# Patient Record
Sex: Male | Born: 1950 | Race: White | Hispanic: No | Marital: Single | Smoking: Former smoker
Health system: Southern US, Community
[De-identification: ages and names within clinical notes are randomized; demographics above are authoritative.]

## PROBLEM LIST (undated history)

## (undated) DIAGNOSIS — G4700 Insomnia, unspecified: Secondary | ICD-10-CM

## (undated) DIAGNOSIS — F419 Anxiety disorder, unspecified: Secondary | ICD-10-CM

## (undated) DIAGNOSIS — D329 Benign neoplasm of meninges, unspecified: Secondary | ICD-10-CM

## (undated) DIAGNOSIS — F329 Major depressive disorder, single episode, unspecified: Secondary | ICD-10-CM

## (undated) DIAGNOSIS — E785 Hyperlipidemia, unspecified: Secondary | ICD-10-CM

## (undated) DIAGNOSIS — K760 Fatty (change of) liver, not elsewhere classified: Secondary | ICD-10-CM

## (undated) DIAGNOSIS — F32A Depression, unspecified: Secondary | ICD-10-CM

## (undated) DIAGNOSIS — M5136 Other intervertebral disc degeneration, lumbar region: Secondary | ICD-10-CM

## (undated) DIAGNOSIS — R51 Headache: Secondary | ICD-10-CM

## (undated) DIAGNOSIS — M51369 Other intervertebral disc degeneration, lumbar region without mention of lumbar back pain or lower extremity pain: Secondary | ICD-10-CM

## (undated) DIAGNOSIS — G4730 Sleep apnea, unspecified: Secondary | ICD-10-CM

## (undated) DIAGNOSIS — K649 Unspecified hemorrhoids: Secondary | ICD-10-CM

## (undated) DIAGNOSIS — R519 Headache, unspecified: Secondary | ICD-10-CM

## (undated) DIAGNOSIS — K644 Residual hemorrhoidal skin tags: Secondary | ICD-10-CM

## (undated) DIAGNOSIS — Z973 Presence of spectacles and contact lenses: Secondary | ICD-10-CM

## (undated) DIAGNOSIS — M1990 Unspecified osteoarthritis, unspecified site: Secondary | ICD-10-CM

## (undated) HISTORY — DX: Other intervertebral disc degeneration, lumbar region: M51.36

## (undated) HISTORY — DX: Sleep apnea, unspecified: G47.30

## (undated) HISTORY — DX: Other intervertebral disc degeneration, lumbar region without mention of lumbar back pain or lower extremity pain: M51.369

## (undated) HISTORY — DX: Hyperlipidemia, unspecified: E78.5

## (undated) HISTORY — DX: Benign neoplasm of meninges, unspecified: D32.9

## (undated) HISTORY — PX: INGUINAL HERNIA REPAIR: SUR1180

---

## 2001-10-30 HISTORY — PX: KNEE ARTHROSCOPY: SUR90

## 2001-10-30 HISTORY — PX: CRANIOTOMY: SHX93

## 2004-08-04 ENCOUNTER — Ambulatory Visit (HOSPITAL_COMMUNITY): Admission: RE | Admit: 2004-08-04 | Discharge: 2004-08-04 | Payer: Self-pay | Admitting: General Surgery

## 2004-08-04 ENCOUNTER — Encounter (INDEPENDENT_AMBULATORY_CARE_PROVIDER_SITE_OTHER): Payer: Self-pay | Admitting: *Deleted

## 2004-08-04 ENCOUNTER — Ambulatory Visit (HOSPITAL_BASED_OUTPATIENT_CLINIC_OR_DEPARTMENT_OTHER): Admission: RE | Admit: 2004-08-04 | Discharge: 2004-08-04 | Payer: Self-pay | Admitting: General Surgery

## 2004-09-21 ENCOUNTER — Ambulatory Visit: Payer: Self-pay | Admitting: Internal Medicine

## 2004-11-21 ENCOUNTER — Ambulatory Visit: Payer: Self-pay | Admitting: Internal Medicine

## 2004-11-23 ENCOUNTER — Ambulatory Visit: Payer: Self-pay | Admitting: Internal Medicine

## 2005-06-22 ENCOUNTER — Ambulatory Visit: Payer: Self-pay | Admitting: Internal Medicine

## 2005-10-30 HISTORY — PX: COLONOSCOPY: SHX174

## 2005-12-06 ENCOUNTER — Ambulatory Visit: Payer: Self-pay | Admitting: Internal Medicine

## 2005-12-11 ENCOUNTER — Ambulatory Visit: Payer: Self-pay | Admitting: Internal Medicine

## 2005-12-19 ENCOUNTER — Ambulatory Visit: Payer: Self-pay | Admitting: Gastroenterology

## 2006-01-08 ENCOUNTER — Ambulatory Visit: Payer: Self-pay | Admitting: Gastroenterology

## 2006-05-10 ENCOUNTER — Ambulatory Visit: Payer: Self-pay | Admitting: Internal Medicine

## 2006-05-17 ENCOUNTER — Ambulatory Visit: Payer: Self-pay | Admitting: Internal Medicine

## 2006-06-12 ENCOUNTER — Ambulatory Visit: Payer: Self-pay | Admitting: Internal Medicine

## 2006-06-14 ENCOUNTER — Ambulatory Visit: Payer: Self-pay

## 2006-06-25 ENCOUNTER — Encounter: Admission: RE | Admit: 2006-06-25 | Discharge: 2006-06-25 | Payer: Self-pay | Admitting: Family Medicine

## 2006-06-29 ENCOUNTER — Encounter: Admission: RE | Admit: 2006-06-29 | Discharge: 2006-06-29 | Payer: Self-pay | Admitting: Family Medicine

## 2006-10-09 ENCOUNTER — Encounter: Admission: RE | Admit: 2006-10-09 | Discharge: 2006-10-09 | Payer: Self-pay | Admitting: Family Medicine

## 2006-10-09 DIAGNOSIS — K760 Fatty (change of) liver, not elsewhere classified: Secondary | ICD-10-CM

## 2006-10-09 HISTORY — DX: Fatty (change of) liver, not elsewhere classified: K76.0

## 2008-11-05 ENCOUNTER — Ambulatory Visit: Payer: Self-pay | Admitting: Sports Medicine

## 2008-11-05 DIAGNOSIS — M722 Plantar fascial fibromatosis: Secondary | ICD-10-CM | POA: Insufficient documentation

## 2010-11-20 ENCOUNTER — Encounter: Payer: Self-pay | Admitting: Family Medicine

## 2011-01-09 ENCOUNTER — Encounter: Payer: Self-pay | Admitting: *Deleted

## 2011-03-17 NOTE — Op Note (Signed)
NAME:  Ricardo Walker, Ricardo Walker NO.:  [id number]   MEDICAL RECORD NO.:  [id number]          PATIENT TYPE:  AMB   LOCATION:  DSC                          FACILITY:  [facility]   PHYSICIAN:  Adolph Pollack, M.D.DATE OF BIRTH:  04-11-1951   DATE OF PROCEDURE:  08/04/2004  DATE OF DISCHARGE:                                 OPERATIVE REPORT   PREOPERATIVE DIAGNOSIS:  Growing soft tissue mass, posterior neck.   POSTOPERATIVE DIAGNOSIS:  Growing soft tissue mass, posterior neck (2.5 cm).   OPERATION PERFORMED:  Excision of 2.5 cm soft tissue mass left posterior  neck.   SURGEON:  Adolph Pollack, M.D.   ANESTHESIA:  Local 1% lidocaine plus 0.5% plain Marcaine.   INDICATIONS FOR PROCEDURE:  This is a 60 year old male who has noticed a  growing soft tissue mass of the neck.  On examination, it measured  approximately 2 cm externally.  He now presents for excision of it.   DESCRIPTION OF PROCEDURE:  The patient was place prone on the minor  procedure table.  Some hair was clipped.  The area was sterilely prepped and  draped.  Local anesthetic was infiltrated superficially and then  circumferentially in the deep tissues around the area.  An elliptical  incision was made directly over the mass and then small skin flaps were  raised. Using sharp dissection, the mass was excised.  It measured 2.5 cm.  It was sent to pathology.  Bleeding was controlled with electrocautery.  Once hemostasis was adequate, the skin was closed with a running 3-0  Monocryl subcuticular stitch followed by Steri-Strips and sterile dressing.   The patient tolerated the procedure well without any apparent complications.  He will be sent home from the minor procedure room and he was in  satisfactory condition.  He was told to call for any signs of infection or  excessive bleeding, if not, see me back in 10 days.  He will be given some  Vicodin for pain.       TJR/MEDQ  D:  08/04/2004  T:  08/04/2004   Job:  [id number]   cc:   Valetta Mole. Swords, M.D. [facility]

## 2014-06-22 ENCOUNTER — Other Ambulatory Visit: Payer: Self-pay | Admitting: Family Medicine

## 2014-06-22 ENCOUNTER — Ambulatory Visit
Admission: RE | Admit: 2014-06-22 | Discharge: 2014-06-22 | Disposition: A | Discharge status: Home / Self Care | Payer: 59 | Source: Ambulatory Visit | Attending: Family Medicine | Admitting: Family Medicine

## 2014-06-22 DIAGNOSIS — M545 Low back pain: Secondary | ICD-10-CM

## 2014-07-15 ENCOUNTER — Other Ambulatory Visit: Payer: Self-pay | Admitting: Family Medicine

## 2014-07-15 DIAGNOSIS — R51 Headache: Secondary | ICD-10-CM

## 2014-07-15 DIAGNOSIS — D429 Neoplasm of uncertain behavior of meninges, unspecified: Secondary | ICD-10-CM

## 2014-07-22 ENCOUNTER — Ambulatory Visit
Admission: RE | Admit: 2014-07-22 | Discharge: 2014-07-22 | Disposition: A | Discharge status: Home / Self Care | Payer: 59 | Source: Ambulatory Visit | Attending: Family Medicine | Admitting: Family Medicine

## 2014-07-22 DIAGNOSIS — D429 Neoplasm of uncertain behavior of meninges, unspecified: Secondary | ICD-10-CM

## 2014-07-22 DIAGNOSIS — R51 Headache: Secondary | ICD-10-CM

## 2014-07-22 MED ORDER — GADOBENATE DIMEGLUMINE 529 MG/ML IV SOLN
16.0000 mL | Freq: Once | INTRAVENOUS | Status: AC | PRN
  Administered 2014-07-22: 16 mL via INTRAVENOUS

## 2014-09-14 ENCOUNTER — Ambulatory Visit (HOSPITAL_BASED_OUTPATIENT_CLINIC_OR_DEPARTMENT_OTHER): Payer: 59 | Attending: Family Medicine | Admitting: *Deleted

## 2014-09-14 VITALS — Ht 67.0 in

## 2014-09-14 DIAGNOSIS — G4700 Insomnia, unspecified: Secondary | ICD-10-CM | POA: Insufficient documentation

## 2014-09-14 DIAGNOSIS — G4730 Sleep apnea, unspecified: Secondary | ICD-10-CM | POA: Diagnosis present

## 2014-09-14 DIAGNOSIS — G4710 Hypersomnia, unspecified: Secondary | ICD-10-CM

## 2014-09-19 DIAGNOSIS — G4730 Sleep apnea, unspecified: Secondary | ICD-10-CM

## 2014-09-19 DIAGNOSIS — G4710 Hypersomnia, unspecified: Secondary | ICD-10-CM

## 2014-09-19 NOTE — Sleep Study (Signed)
    NAME: Ricardo Walker DATE OF BIRTH:  [birth year] MEDICAL RECORD NUMBER [id number]  LOCATION: [facility]  PHYSICIAN: YOUNG,CLINTON D  DATE OF STUDY: 09/14/2014  SLEEP STUDY TYPE: Out of Center Sleep Test- unattended                REFERRING PHYSICIAN: Marylene Land, MD  INDICATION FOR STUDY: Insomnia with sleep apnea  EPWORTH SLEEPINESS SCORE:   9/24 HEIGHT: 5\' 7"  (170.2 cm)  WEIGHT:   168 pounds  BMI 26.3 NECK SIZE:   in.  MEDICATIONS: Charted for review  IMPRESSION:  Moderate obstructive sleep apnea/hypopnea syndrome, AHI 20.9 per hour. 146 total events scored including 3 central apneas, 31 obstructive apneas, 4 mixed apneas, 108 hypopneas. Snoring with oxygen desaturation to a nadir of 85% and average oxygen saturation 93% on room air. Average heart rate 67.3 bpm    RECOMMENDATION:  Scores in this range are usually addressed first with CPAP. If appropriate, this patient can be scheduled for a dedicated CPAP titration study through the sleep disorder Center. Treatment for his insomnia complaint may help during adjustment to initial CPAP therapy if indicated.   Deneise Lever Diplomate, American Board of Sleep Medicine  ELECTRONICALLY SIGNED ON:  09/19/2014, 9:11 AM [place] PH: [phone]   FX: [phone] [place]

## 2014-10-06 ENCOUNTER — Ambulatory Visit (INDEPENDENT_AMBULATORY_CARE_PROVIDER_SITE_OTHER): Payer: 59 | Admitting: Pulmonary Disease

## 2014-10-06 ENCOUNTER — Encounter: Payer: Self-pay | Admitting: *Deleted

## 2014-10-06 ENCOUNTER — Encounter (INDEPENDENT_AMBULATORY_CARE_PROVIDER_SITE_OTHER): Payer: Self-pay

## 2014-10-06 VITALS — BP 116/60 | HR 73 | Temp 97.7°F | Ht 67.0 in | Wt 181.4 lb

## 2014-10-06 DIAGNOSIS — G4733 Obstructive sleep apnea (adult) (pediatric): Secondary | ICD-10-CM | POA: Insufficient documentation

## 2014-10-06 NOTE — Progress Notes (Signed)
Subjective:    Patient ID: Ricardo Walker, male    DOB: 1951/07/29, 63 y.o.   MRN: [id number]  HPI The patient is a 63 year old male who I've been asked to see for management of obstructive sleep apnea. He has had a recent home sleep test which showed an AHI of 21 events per hour, with transient oxygen desaturation as low as 85%. The patient has been noted to have snoring, but no one has ever commented on an abnormal breathing pattern during sleep. The patient has had long-standing insomnia, but over the last one year he has noticed worsening sleep and feeling exhausted during the day. He has frequent awakenings at night, and is not rested in the mornings even when he does get a good length of sleep. He has noticed significant sleep pressure with inactivity during the day, and this has affected his concentration and work efficiency. He falls asleep easily in the evenings while trying to watch television or read, but denies any sleepiness with driving. The patient states that his weight is up about 5-10 pounds over the last 2 years. His Epworth score today is abnormal at 10.   Sleep Questionnaire What time do you typically go to bed?( Between what hours) 11:30p-12:30A 11:30p-12:30A at 1519 on 10/06/14 by Inge Rise, CMA How long does it take you to fall asleep? 15-1 hr 15-1 hr at 1519 on 10/06/14 by Inge Rise, CMA How many times during the night do you wake up? 2 2 at 1519 on 10/06/14 by Inge Rise, CMA What time do you get out of bed to start your day? 0730 0730 at 1519 on 10/06/14 by Inge Rise, CMA Do you drive or operate heavy machinery in your occupation? No No at 1519 on 10/06/14 by Inge Rise, CMA How much has your weight changed (up or down) over the past two years? (In pounds) 10 lb (4.536 kg) 10 lb (4.536 kg) at 1519 on 10/06/14 by Inge Rise, CMA Have you ever had a sleep study before? Yes Yes at 1519 on 10/06/14 by [place] If yes, location of  study? home sleep test home sleep test at 1519 on 10/06/14 by Inge Rise, CMA If yes, date of study? 08/2014 08/2014 at 1519 on 10/06/14 by Inge Rise, CMA Do you currently use CPAP? No No at 1519 on 10/06/14 by Inge Rise, CMA Do you wear oxygen at any time? No    Review of Systems  Constitutional: Negative for fever and unexpected weight change.  HENT: Positive for congestion, sneezing and sore throat. Negative for dental problem, ear pain, nosebleeds, postnasal drip, rhinorrhea, sinus pressure and trouble swallowing.   Eyes: Negative for redness and itching.  Respiratory: Positive for cough. Negative for chest tightness, shortness of breath and wheezing.   Cardiovascular: Negative for palpitations and leg swelling.  Gastrointestinal: Negative for nausea and vomiting.  Genitourinary: Negative for dysuria.  Musculoskeletal: Negative for joint swelling.  Skin: Negative for rash.  Neurological: Positive for headaches.  Hematological: Does not bruise/bleed easily.  Psychiatric/Behavioral: Negative for dysphoric mood. The patient is nervous/anxious.        Objective:   Physical Exam Constitutional:  Well developed, no acute distress  HENT:  Nares patent without discharge  Oropharynx without exudate, palate and uvula are thick and elongated  Eyes:  Perrla, eomi, no scleral icterus  Neck:  No JVD, no TMG  Cardiovascular:  Normal rate, regular rhythm, no rubs or  gallops.  No murmurs        Intact distal pulses  Pulmonary :  Normal breath sounds, no stridor or respiratory distress   No rales, rhonchi, or wheezing  Abdominal:  Soft, nondistended, bowel sounds present.  No tenderness noted.   Musculoskeletal:  No lower extremity edema noted.  Lymph Nodes:  No cervical lymphadenopathy noted  Skin:  No cyanosis noted  Neurologic:  Alert, appropriate, moves all 4 extremities without obvious deficit.         Assessment & Plan:

## 2014-10-06 NOTE — Assessment & Plan Note (Signed)
The patient has at least moderate obstructive sleep apnea by his recent home sleep study, and has noticed worsening sleep and daytime sleepiness over the last one year. I have reviewed with him the pathophysiology of sleep disordered breathing, including its impact to his quality of life and cardiovascular health. I have outlined possible treatment options to include a trial of weight loss alone, dental appliance, and also C Pap. The patient does not feel that he has the energy or the discipline currently to be able to lose weight effectively. 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 He is most interested in getting this treated as quickly as possible so that he can feel better, and would like to start on C Pap as a trial. We can certainly arrange for a dental medicine consult as well, so he can look at other options. We'll discuss with him further at the next visit. The patient understands that his C Pap May short-term worsen his insomnia issue, although he may actually do much better. At some point, I think he would benefit greatly from cognitive behavioral therapy with a behavioral psychologist. I have asked him to discuss this further with his primary care physician in the future.

## 2014-10-06 NOTE — Patient Instructions (Signed)
Will arrange for cpap.  Please call if having tolerance issues. Work on weight reduction followup with me again in 8 weeks.

## 2014-10-26 ENCOUNTER — Institutional Professional Consult (permissible substitution): Payer: 59 | Admitting: Pulmonary Disease

## 2014-10-28 ENCOUNTER — Encounter (HOSPITAL_BASED_OUTPATIENT_CLINIC_OR_DEPARTMENT_OTHER): Payer: 59

## 2014-11-04 ENCOUNTER — Ambulatory Visit (HOSPITAL_BASED_OUTPATIENT_CLINIC_OR_DEPARTMENT_OTHER): Payer: 59

## 2014-12-03 ENCOUNTER — Ambulatory Visit (INDEPENDENT_AMBULATORY_CARE_PROVIDER_SITE_OTHER): Payer: 59 | Admitting: Pulmonary Disease

## 2014-12-03 ENCOUNTER — Encounter: Payer: Self-pay | Admitting: Pulmonary Disease

## 2014-12-03 VITALS — BP 132/78 | HR 85 | Temp 98.1°F | Ht 67.0 in | Wt 178.2 lb

## 2014-12-03 DIAGNOSIS — G4733 Obstructive sleep apnea (adult) (pediatric): Secondary | ICD-10-CM

## 2014-12-03 NOTE — Patient Instructions (Signed)
Try the dreamware nasal pillows to see if more comfortable Will refer to dentist for evaluation for possible oral appliance Will send a recommendation to your primary doctor to refer you to a psychologist for CBT to help with your insomnia followup with me in 52mos if doing well.

## 2014-12-03 NOTE — Progress Notes (Signed)
   Subjective:    Patient ID: Ricardo Walker, male    DOB: [birth year], 64 y.o.   MRN: [id number]  HPI The patient comes in today for follow-up of his obstructive sleep apnea. He was started on CPAP at the last visit, and has had variable response. When he sleeps through the night, he does extremely well. When he has issues with his insomnia, or problems with hose control, he sleeps poorly.  His download shows excellent control of his AHI, and no significant mask leak.   Review of Systems  Constitutional: Negative for fever and unexpected weight change.  HENT: Negative for congestion, dental problem, ear pain, nosebleeds, postnasal drip, rhinorrhea, sinus pressure, sneezing, sore throat and trouble swallowing.   Eyes: Negative for redness and itching.  Respiratory: Negative for cough, chest tightness, shortness of breath and wheezing.   Cardiovascular: Negative for palpitations and leg swelling.  Gastrointestinal: Negative for nausea and vomiting.  Genitourinary: Negative for dysuria.  Musculoskeletal: Negative for joint swelling.  Skin: Negative for rash.  Neurological: Negative for headaches.  Hematological: Does not bruise/bleed easily.  Psychiatric/Behavioral: Negative for dysphoric mood. The patient is not nervous/anxious.        Objective:   Physical Exam Well-developed male in no acute distress Nose without purulence or discharge noted Neck without lymphadenopathy or thyromegaly No skin breakdown or pressure necrosis from the C Pap mask Lower extremities without edema, no cyanosis Alert and oriented, moves all 4 extremities.       Assessment & Plan:

## 2014-12-03 NOTE — Assessment & Plan Note (Signed)
The patient has seen a definite improvement in his sleep with the C Pap, he continues to have some mask fit issues as well as problems with the hose. When he gets to sleep and sleeps through the night, he feels extremely rested, and wonderful during the day. His download shows excellent control of his AHI, and no significant mask leaks. I have shown him a new nasal mask that helps with hose control, and he is interested in trying this. He would also like to see a specialist for a dental appliance to see if this would work for him as well. Finally, he continues to have issues with his insomnia, and I will send a note to his primary physician to get him referred to a behavioral psychologist for cognitive behavioral therapy.

## 2014-12-11 ENCOUNTER — Telehealth: Payer: Self-pay | Admitting: Pulmonary Disease

## 2014-12-11 NOTE — Telephone Encounter (Signed)
Called [employer]-patient went by their office after his call to our office and received his mask. I left message stating we are aware he received his mask after the phone call today and if he has any other questions or concerns to please contact our office. Nothing more needed at this time.

## 2015-01-01 ENCOUNTER — Telehealth: Payer: Self-pay | Admitting: Pulmonary Disease

## 2015-01-01 NOTE — Telephone Encounter (Signed)
lmomtcb x1 for pt on named VM.  

## 2015-01-04 NOTE — Telephone Encounter (Signed)
Pt returned call - [phone]

## 2015-01-04 NOTE — Telephone Encounter (Signed)
Spoke with pt, states the last few weeks he has had difficulty exhaling because his pressure seems too high.  Also states the machine is very noise.  States his machine has a "pressure relief" button that is unavailable to him.  Wants to know what his option is with this.  Also states that he saw Dr. Ron Parker to explore a dental appliance option, but Dr. Ron Parker is not covered by his insurance.  Wants to know if there is another dentist [place] recommends.    Pt aware [place] is out until tomorrow. [place] please advise.  Thanks!

## 2015-01-06 NOTE — Telephone Encounter (Signed)
[place] please advise.  Thanks!

## 2015-01-06 NOTE — Telephone Encounter (Signed)
His machine is set on auto.  The machine will only give him what he needs.  If we decrease the pressure, it may not adequately control his sleep apnea.  The pressure relief button is only needed for pts who are on high pressure.  Can refer to dentist at [facility] or in [place].  Or he can get name from insurance who they do cover in [place]. Let me know and we can make the referral .

## 2015-01-07 NOTE — Telephone Encounter (Signed)
lmtcb x1 

## 2015-01-07 NOTE — Telephone Encounter (Signed)
Spoke with the pt and notified of recs per [facility]  He verbalized understanding  He states that he will call ins to check on Dentist here in town  In the meantime, he wants to know if trying a different mask would help  He currently uses a dream wear nasal mask, and when he turns over in bed it comes off slightly and makes loud noise  Please advise, thanks!

## 2015-01-07 NOTE — Telephone Encounter (Signed)
It may or may not help to change masks.  If he is not satisfied with current mask, can go to his home care company and have them work with him on a better fit.

## 2015-01-08 ENCOUNTER — Telehealth: Payer: Self-pay | Admitting: Pulmonary Disease

## 2015-01-08 NOTE — Telephone Encounter (Signed)
Virl Cagey, CMA at 01/08/2015 9:07 AM     Status: Signed       Expand All Collapse All   LMTCB x 1            Kathee Delton, MD at 01/07/2015 5:46 PM     Status: Signed       Expand All Collapse All   It may or may not help to change masks. If he is not satisfied with current mask, can go to his home care company and have them work with him on a better      --   Called spoke with pt. He reports his PCP told him that a home sleep study is not detailed as sleep study done in sleep center. He wants to know if he has a sleep study done over at sleep center, if this would show more information and if he would benefit from having this done. He is not sure if it is mask causing his problems. He reports this is disrupting his sleep and he has tried other masks. Pt is still looking into dental appliance. Please advise [place] thanks

## 2015-01-08 NOTE — Telephone Encounter (Signed)
Pt returning call.Ricardo Walker ° °

## 2015-01-08 NOTE — Telephone Encounter (Signed)
LMTCB x 1 

## 2015-01-13 ENCOUNTER — Other Ambulatory Visit: Payer: Self-pay | Admitting: Pulmonary Disease

## 2015-01-13 DIAGNOSIS — G4733 Obstructive sleep apnea (adult) (pediatric): Secondary | ICD-10-CM

## 2015-01-13 NOTE — Telephone Encounter (Signed)
Pt is aware of KC's recommendation. He would like to proceed with the titration study.

## 2015-01-13 NOTE — Telephone Encounter (Signed)
There is absolutely no benefit from having a regular sleep study at the sleep center.  He has sleep apnea, and the home study is very accurate for sleep apnea.   If he feels his device is not set correctly, we can send him to sleep center for a TITRATION study where his pressure is adjusted during the night while sleeping.  Let me know if he wants to do this.

## 2015-01-13 NOTE — Telephone Encounter (Signed)
lmtcb x1 

## 2015-01-13 NOTE — Telephone Encounter (Signed)
Pt returning call.Ricardo Walker ° °

## 2015-01-13 NOTE — Telephone Encounter (Signed)
Order put in computer.

## 2015-01-14 NOTE — Telephone Encounter (Signed)
Called and left detailed message on pt's personal voicemail stating the order has been placed and someone will be contacting him to get it scheduled and to call back if needed. Will sign off.

## 2015-02-03 ENCOUNTER — Ambulatory Visit (HOSPITAL_BASED_OUTPATIENT_CLINIC_OR_DEPARTMENT_OTHER): Payer: 59 | Attending: Pulmonary Disease

## 2015-02-03 VITALS — Ht 68.0 in | Wt 170.0 lb

## 2015-02-03 DIAGNOSIS — G4733 Obstructive sleep apnea (adult) (pediatric): Secondary | ICD-10-CM | POA: Insufficient documentation

## 2015-02-03 DIAGNOSIS — G4710 Hypersomnia, unspecified: Secondary | ICD-10-CM | POA: Diagnosis present

## 2015-02-05 ENCOUNTER — Telehealth: Payer: Self-pay | Admitting: Pulmonary Disease

## 2015-02-05 NOTE — Telephone Encounter (Signed)
lmomtcb x1 for pt I have not received anything

## 2015-02-08 NOTE — Telephone Encounter (Signed)
lmtcb x2 

## 2015-02-09 NOTE — Telephone Encounter (Signed)
lmtcb

## 2015-02-10 NOTE — Telephone Encounter (Signed)
LMTCB and will close per triage protocol 

## 2015-02-12 ENCOUNTER — Telehealth: Payer: Self-pay | Admitting: Pulmonary Disease

## 2015-02-12 DIAGNOSIS — G4733 Obstructive sleep apnea (adult) (pediatric): Secondary | ICD-10-CM

## 2015-02-12 NOTE — Telephone Encounter (Signed)
lmomtcb x1 

## 2015-02-12 NOTE — Telephone Encounter (Signed)
Let pt know that his titration study shows good control of his sleep apnea with 6cm of pressure.  We can have his machine put on that level and see how he does.  Ok to send order if he is ok with that.

## 2015-02-12 NOTE — Sleep Study (Signed)
   NAME: Ricardo Walker DATE OF BIRTH:  03-Jul-1951 MEDICAL RECORD NUMBER [id number]  LOCATION: [facility]  PHYSICIAN: Kathee Delton  DATE OF STUDY: 02/03/2015  SLEEP STUDY TYPE: Nocturnal Polysomnogram               REFERRING PHYSICIAN: Burrell Hodapp, Armando Reichert, MD  INDICATION FOR STUDY: Hypersomnia with sleep apnea  EPWORTH SLEEPINESS SCORE:  7 HEIGHT: 5\' 8"  (172.7 cm)  WEIGHT: 170 lb (77.111 kg)    Body mass index is 25.85 kg/(m^2).  NECK SIZE: 16 in.  MEDICATIONS: Reviewed in the sleep record  SLEEP ARCHITECTURE: The patient had a total sleep time of 257 minutes with no slow-wave sleep but adequate quantity of REM. Sleep onset latency was prolonged at 68 minutes, and REM onset was normal. Sleep efficiency was moderately reduced at 71%.  RESPIRATORY DATA: The patient underwent a CPAP titration study with a [employer]ics dreamware nasal pillows system.  His C pressure was increased in order to treat both obstructive events and snoring, and he was found to have an optimal CPAP pressure of 6 cm.  He had good control of his events even through supine REM.  OXYGEN DATA: There was transient oxygen desaturation as low as 89% with the patient's obstructive events  CARDIAC DATA: No clinically significant arrhythmias were noted  MOVEMENT/PARASOMNIA: No significant periodic limb movements or other abnormal behaviors were seen.  IMPRESSION/ RECOMMENDATION:    1) good control of previously documented obstructive sleep apnea with a sleep apnea or of 6 cm, along with a Respironics dreamwear nasal pillows system.    [place], American Board of Sleep Medicine  ELECTRONICALLY SIGNED ON:  02/12/2015, 4:37 PM [place] PH: [phone]   FX: [phone] [place]

## 2015-02-15 NOTE — Telephone Encounter (Signed)
Pt wanting to wait to see if he qualifies for the dental device before cancelling the cpap.  Pt will keep [place] updated with this so we know when do d/c cpap.   Order placed for oral appliance.  Nothing further needed.

## 2015-02-15 NOTE — Telephone Encounter (Signed)
Called spoke with pt. He is wanting the oral appliance and not use the CPAP. He is wanting referral sent to [facility] Dr. Merla Riches school of dentistry. Please advise [place] thanks

## 2015-02-15 NOTE — Telephone Encounter (Signed)
[place] for dental medicine referral to Dr. Merla Riches at [facility] for OSA If pt is ok with this, would also send order to d/c his cpap to his DME

## 2015-03-05 ENCOUNTER — Ambulatory Visit
Admission: RE | Admit: 2015-03-05 | Discharge: 2015-03-05 | Disposition: A | Discharge status: Home / Self Care | Payer: 59 | Source: Ambulatory Visit | Attending: Family Medicine | Admitting: Family Medicine

## 2015-03-05 ENCOUNTER — Other Ambulatory Visit: Payer: Self-pay | Admitting: Family Medicine

## 2015-03-05 DIAGNOSIS — R05 Cough: Secondary | ICD-10-CM

## 2015-03-05 DIAGNOSIS — R059 Cough, unspecified: Secondary | ICD-10-CM

## 2015-04-23 ENCOUNTER — Telehealth: Payer: Self-pay | Admitting: Pulmonary Disease

## 2015-04-23 NOTE — Telephone Encounter (Signed)
Spoke with pt, states that his insurance is not going to be covering his dental appliance for sleep apnea d/t "not being medically necessary".  I advised that his necessary records were sent on 4/21 to Dr. Lupita Shutter office according to our referral notes.  I advised pt that Dr. Lupita Shutter office would be his best contact as to why this is not being covered.  Pt understands.  Nothing further needed at this time .

## 2015-06-03 ENCOUNTER — Ambulatory Visit: Payer: 59 | Admitting: Pulmonary Disease

## 2015-07-23 ENCOUNTER — Other Ambulatory Visit: Payer: Self-pay | Admitting: Family Medicine

## 2015-07-23 ENCOUNTER — Ambulatory Visit
Admission: RE | Admit: 2015-07-23 | Discharge: 2015-07-23 | Disposition: A | Discharge status: Home / Self Care | Payer: 59 | Source: Ambulatory Visit | Attending: Family Medicine | Admitting: Family Medicine

## 2015-07-23 DIAGNOSIS — M79672 Pain in left foot: Secondary | ICD-10-CM

## 2015-09-30 HISTORY — PX: HAMMER TOE SURGERY: SHX385

## 2016-01-26 ENCOUNTER — Other Ambulatory Visit: Payer: Self-pay | Admitting: Family Medicine

## 2016-01-26 DIAGNOSIS — R519 Headache, unspecified: Secondary | ICD-10-CM

## 2016-01-26 DIAGNOSIS — R51 Headache: Principal | ICD-10-CM

## 2016-01-27 ENCOUNTER — Encounter: Payer: Self-pay | Admitting: Internal Medicine

## 2016-02-07 ENCOUNTER — Inpatient Hospital Stay: Admission: RE | Admit: 2016-02-07 | Payer: Self-pay | Source: Ambulatory Visit

## 2016-03-16 ENCOUNTER — Encounter: Payer: Self-pay | Admitting: Internal Medicine

## 2016-03-16 ENCOUNTER — Ambulatory Visit (AMBULATORY_SURGERY_CENTER): Payer: Self-pay | Admitting: *Deleted

## 2016-03-16 VITALS — Ht 67.0 in | Wt 165.0 lb

## 2016-03-16 DIAGNOSIS — Z1211 Encounter for screening for malignant neoplasm of colon: Secondary | ICD-10-CM

## 2016-03-16 NOTE — Progress Notes (Signed)
No egg or soy allergy known to patient  No issues with past sedation with any surgeries  or procedures, no intubation problems  No diet pills per patient No home 02 use per patient  No blood thinners per patient  Pt denies issues with constipation   

## 2016-03-30 ENCOUNTER — Encounter: Payer: BLUE CROSS/BLUE SHIELD | Admitting: Internal Medicine

## 2016-03-30 ENCOUNTER — Encounter: Payer: Self-pay | Admitting: Internal Medicine

## 2016-05-12 ENCOUNTER — Encounter: Payer: Self-pay | Admitting: Internal Medicine

## 2016-07-11 ENCOUNTER — Inpatient Hospital Stay: Admission: RE | Admit: 2016-07-11 | Payer: Self-pay | Source: Ambulatory Visit

## 2016-07-18 ENCOUNTER — Inpatient Hospital Stay
Admission: RE | Admit: 2016-07-18 | Discharge: 2016-07-18 | Disposition: A | Discharge status: Home / Self Care | Payer: BLUE CROSS/BLUE SHIELD | Source: Ambulatory Visit | Attending: Family Medicine | Admitting: Family Medicine

## 2016-07-24 ENCOUNTER — Ambulatory Visit
Admission: RE | Admit: 2016-07-24 | Discharge: 2016-07-24 | Disposition: A | Discharge status: Home / Self Care | Payer: BLUE CROSS/BLUE SHIELD | Source: Ambulatory Visit | Attending: Family Medicine | Admitting: Family Medicine

## 2016-07-24 DIAGNOSIS — R519 Headache, unspecified: Secondary | ICD-10-CM

## 2016-07-24 DIAGNOSIS — R51 Headache: Principal | ICD-10-CM

## 2016-07-24 MED ORDER — GADOBENATE DIMEGLUMINE 529 MG/ML IV SOLN
14.0000 mL | Freq: Once | INTRAVENOUS | Status: AC | PRN
  Administered 2016-07-24: 14 mL via INTRAVENOUS

## 2016-08-08 ENCOUNTER — Encounter: Payer: Self-pay | Admitting: Pulmonary Disease

## 2016-08-09 ENCOUNTER — Encounter: Payer: Self-pay | Admitting: Pulmonary Disease

## 2016-08-09 ENCOUNTER — Ambulatory Visit (INDEPENDENT_AMBULATORY_CARE_PROVIDER_SITE_OTHER): Payer: BLUE CROSS/BLUE SHIELD | Admitting: Pulmonary Disease

## 2016-08-09 DIAGNOSIS — G4733 Obstructive sleep apnea (adult) (pediatric): Secondary | ICD-10-CM | POA: Diagnosis not present

## 2016-08-09 NOTE — Progress Notes (Signed)
Subjective:    Patient ID: Ricardo Walker, male    DOB: [birth year], 65 y.o.   MRN: [id number]  HPI   Patient is in the office as follow-up on his sleep apnea. He was last seen by Dr. Gwenette Greet on 11/2014 for OSA. He had a HST in 08/2014 which showed AHI of 21. He has an autocpap.  He has had mask issues. He has tried several masks and the only one working finally was Lawyer. The last couple months however, team where it doesn't seem to be working as well. He does not remember trying a full face mask. Patient also has issues with his CPAP per se. He is very uncomfortable using it. Gets claustrophobic. He is not sure if the pressures too much. Because of his issues with his CPAP and the mass, he gets poor quality sleep. He has frequent awakenings, has hypersomnia, unrefreshed sleep. He gets very moody and irritable June the daytime because of this.  He goes to bed anywhere  from 11pm -1 am. Gets up at 7:30am. Sleeps 6-7 hrs/night. Very exhausted.  He denies being depressed. Denies anxiety.    He tried getting an oral device. He had gone to Dr. Merla Riches at [facility] as well as Dr. Ron Parker in [place]. Both times, his insurance denied his oral device. He currently has a Education officer, museum.   Review of Systems  Constitutional: Negative.   HENT: Negative.   Eyes: Negative.   Respiratory: Negative.   Cardiovascular: Negative.   Endocrine: Negative.   Genitourinary: Negative.   Musculoskeletal: Negative.   Skin: Negative.   Allergic/Immunologic: Negative.   Neurological: Negative.   Hematological: Negative.   Psychiatric/Behavioral: Negative.   All other systems reviewed and are negative.  Past Medical History:  Diagnosis Date  . DDD (degenerative disc disease), lumbar    toe  . Hyperlipidemia   . Meningioma ([place])   . Sleep apnea    cpap   (-) CA, DVT  Family History  Problem Relation Age of Onset  . Pancreatic cancer      grandparent  . Heart disease    . Pancreatic  cancer Paternal Grandfather   . Colon cancer Neg Hx   . Colon polyps Neg Hx   . Rectal cancer Neg Hx   . Stomach cancer Neg Hx      Past Surgical History:  Procedure Laterality Date  . COLONOSCOPY    . CRANIOTOMY  2003  . HAMMER TOE SURGERY  09/2015  . INGUINAL HERNIA REPAIR  [id number]   l,r  . KNEE ARTHROSCOPY  2003   right    Social History   Social History  . Marital status: Single    Spouse name: N/A  . Number of children: N/A  . Years of education: N/A   Occupational History  . realtor    Social History Main Topics  . Smoking status: Former Smoker    Packs/day: 0.25    Years: 10.00    Types: Cigarettes    Quit date: 10/30/2010  . Smokeless tobacco: Never Used     Comment: social smoker  . Alcohol use 0.0 oz/week     Comment: 2-7 per week  . Drug use: No  . Sexual activity: Not on file   Other Topics Concern  . Not on file   Social History Narrative  . No narrative on file     Single, lives by himeslf. Is a realtor. Drinks few drinks nightly.  No Known Allergies  Outpatient Medications Prior to Visit  Medication Sig Dispense Refill  . ALPRAZolam (XANAX) 0.5 MG tablet Take 0.25-0.5 mg by mouth at bedtime as needed for sleep.    Marland Kitchen glucosamine-chondroitin 500-400 MG tablet Take 1 tablet by mouth 2 (two) times daily before a meal.    . Multiple Vitamin (MULTIVITAMIN) tablet Take 1 tablet by mouth daily.     No facility-administered medications prior to visit.    Meds ordered this encounter  Medications  . amitriptyline (ELAVIL) 10 MG tablet    Sig: as directed.    Refill:  4        Objective:   Physical Exam   Vitals:  Vitals:   08/09/16 1556  BP: 120/68  Pulse: (!) 57  SpO2: 97%  Weight: 161 lb 6.4 oz (73.2 kg)  Height: 5\' 7"  (1.702 m)    Constitutional/General:  Pleasant, well-nourished, well-developed, not in any distress,  Comfortably seating.  Well kempt  Body mass index is 25.28 kg/m. Wt Readings from Last 3 Encounters:    08/09/16 161 lb 6.4 oz (73.2 kg)  03/16/16 165 lb (74.8 kg)  02/03/15 170 lb (77.1 kg)     HEENT: Pupils equal and reactive to light and accommodation. Anicteric sclerae. Normal nasal mucosa.   No oral  lesions,  mouth clear,  oropharynx clear, no postnasal drip. (-) Oral thrush. No dental caries. Mild retrognathia.  Airway - Mallampati class II  Neck: No masses. Midline trachea. No JVD, (-) LAD. (-) bruits appreciated.  Respiratory/Chest: Grossly normal chest. (-) deformity. (-) Accessory muscle use.  Symmetric expansion. (-) Tenderness on palpation.  Resonant on percussion.  Diminished BS on both lower lung zones. (-) wheezing, crackles, rhonchi (-) egophony  Cardiovascular: Regular rate and  rhythm, heart sounds normal, no murmur or gallops, no peripheral edema  Gastrointestinal:  Normal bowel sounds. Soft, non-tender. No hepatosplenomegaly.  (-) masses.   Musculoskeletal:  Normal muscle tone. Normal gait.   Extremities: Grossly normal. (-) clubbing, cyanosis.  (-) edema  Skin: (-) rash,lesions seen.   Neurological/Psychiatric : alert, oriented to time, place, person. Normal mood and affect         Assessment & Plan:  OSA (obstructive sleep apnea) Patient had home sleep test in November for 2015 which showed AHI of 21. He has an auto CPAP, 5-15 cm water.  Patient has had issues tolerating his CPAP. He was last seen by Dr. Gwenette Greet in 11/2014. Since that time, he went to Dr. Merla Riches at [facility] for oral device. His oral device was not approved by his insurance. He also went to Dr. Ron Parker in [place]. Oral device was not approved.  The sense I am  getting from this patient is he is miserable with CPAP use since day 1.  He has a lot of issues with CPAP. He is uncomfortable with it. Has claustrophobia. He has tried several masks. The last mask which worked was Lawyer but lately it is not working as well. I believe he has not tried fullface mask. He thinks the pressure is too  much as well. Because of his issues, he does not get good sleep, has frequent awakenings, wakes up unrefreshed. Has hypersomnia in the morning which affects his functionality. He gets moody and irritable in the morning when he is not able to get good sleep.  Download the last month: 87%, AHI 0.3. (oct 2017)  Plan :  We extensively discussed the importance of treating OSA and the need to use PAP therapy. I told  him, at the end of the day, I don't want him miserable when he uses a CPAP. I discussed with him I will try to help him use his CPAP but if he's been miserable since day 1 of using it, sometimes it's hard to change that perception.  Continue with autocpap > we will decrease the pressure to 5-10 centimeters water and will need a download on that. Potentially, we can decrease it further to 5-8 centimeters water.  We will schedule the patient to have a mask fitting session with one of our sleep techs here in the lab. Hopefully, dreamwear will work again. He can also try a full face mask. Cloth mask is also another consideration.   Patient was instructed to have mask, tubings, filter, reservoir cleaned at least once a week with soapy water.  Patient was instructed to call the office if he/she is having issues with the PAP device.   I advised patient to obtain sufficient amount of sleep --  7 to 8 hours at least in a 24 hr period.  Patient was advised to follow good sleep hygiene.  Patient was advised NOT to engage in activities requiring concentration and/or vigilance if he/she is and  sleepy.  Patient is NOT to drive if he/she is sleepy.   I told the patient to go ahead and follow up with Dr. Ron Parker to see whether he can have an oral device approved with his new insurance.     Return to clinic in 6 weeks.  I spent at least 25 minutes with the patient today and more than 50% was spent counseling the patient regarding disease and management and facilitating labs and medications.        Monica Becton, MD 08/10/2016, 10:07 AM [place] [facility] Pager [phone] After 3 pm or if no answer, call [phone]

## 2016-08-09 NOTE — Assessment & Plan Note (Addendum)
Patient had home sleep test in November for 2015 which showed AHI of 21. He has an auto CPAP, 5-15 cm water.  Patient has had issues tolerating his CPAP. He was last seen by Dr. Gwenette Greet in 11/2014. Since that time, he went to Dr. Merla Riches at [facility] for oral device. His oral device was not approved by his insurance. He also went to Dr. Ron Parker in [place]. Oral device was not approved.  The sense I am  getting from this patient is he is miserable with CPAP use since day 1.  He has a lot of issues with CPAP. He is uncomfortable with it. Has claustrophobia. He has tried several masks. The last mask which worked was Lawyer but lately it is not working as well. I believe he has not tried fullface mask. He thinks the pressure is too much as well. Because of his issues, he does not get good sleep, has frequent awakenings, wakes up unrefreshed. Has hypersomnia in the morning which affects his functionality. He gets moody and irritable in the morning when he is not able to get good sleep.  Download the last month: 87%, AHI 0.3. (oct 2017)  Plan :  We extensively discussed the importance of treating OSA and the need to use PAP therapy. I told him, at the end of the day, I don't want him miserable when he uses a CPAP. I discussed with him I will try to help him use his CPAP but if he's been miserable since day 1 of using it, sometimes it's hard to change that perception.  Continue with autocpap > we will decrease the pressure to 5-10 centimeters water and will need a download on that. Potentially, we can decrease it further to 5-8 centimeters water.  We will schedule the patient to have a mask fitting session with one of our sleep techs here in the lab. Hopefully, dreamwear will work again. He can also try a full face mask. Cloth mask is also another consideration.   Patient was instructed to have mask, tubings, filter, reservoir cleaned at least once a week with soapy water.  Patient was instructed to call the  office if he/she is having issues with the PAP device.   I advised patient to obtain sufficient amount of sleep --  7 to 8 hours at least in a 24 hr period.  Patient was advised to follow good sleep hygiene.  Patient was advised NOT to engage in activities requiring concentration and/or vigilance if he/she is and  sleepy.  Patient is NOT to drive if he/she is sleepy.   I told the patient to go ahead and follow up with Dr. Ron Parker to see whether he can have an oral device approved with his new insurance.

## 2016-08-09 NOTE — Patient Instructions (Signed)
  It was a pleasure taking care of you today!  Continue using your CPAP machine.  We will try to get a download from her machine. We will schedule you for a mask fitting session at our sleep lab. We will try to decrease her pressure to 5-10 centimeters water.  Please make sure you use your CPAP device everytime you sleep.  We will monitor the usage of your machine per your insurance requirement.  Your insurance company may take the machine from you if you are not using it regularly.   Please clean the mask, tubings, filter, water reservoir with soapy water every week.  Please use distilled water for the water reservoir.   Please call the office or your machine provider (DME company) if you are having issues with the device.   Return to clinic in 6-8  weeks with Dr. Corrie Dandy

## 2016-08-17 ENCOUNTER — Telehealth: Payer: Self-pay | Admitting: Pulmonary Disease

## 2016-08-17 DIAGNOSIS — G4733 Obstructive sleep apnea (adult) (pediatric): Secondary | ICD-10-CM

## 2016-08-17 NOTE — Telephone Encounter (Signed)
Called spoke with pt. He states that he saw AD on 08/09/16. He states that a mask fitting session was suppose to be ordered. I explained to him that the order was placed as a referral and a new order will need to be placed. He voiced understanding and had no further questions. Order has been placed, nothing further needed.

## 2016-08-18 ENCOUNTER — Encounter: Payer: Self-pay | Admitting: Internal Medicine

## 2016-08-28 ENCOUNTER — Ambulatory Visit (HOSPITAL_BASED_OUTPATIENT_CLINIC_OR_DEPARTMENT_OTHER): Payer: BLUE CROSS/BLUE SHIELD | Attending: Pulmonary Disease | Admitting: Radiology

## 2016-08-28 DIAGNOSIS — G4733 Obstructive sleep apnea (adult) (pediatric): Secondary | ICD-10-CM

## 2016-09-05 ENCOUNTER — Telehealth: Payer: Self-pay | Admitting: Pulmonary Disease

## 2016-09-05 DIAGNOSIS — G4733 Obstructive sleep apnea (adult) (pediatric): Secondary | ICD-10-CM

## 2016-09-05 NOTE — Telephone Encounter (Signed)
Ok to revert to previous settings of cpap 5-15 cm water. OK to change DME to [facility]. Thanks.   Monica Becton, MD 09/05/2016, 6:32 PM [place] [facility] Pager [phone] After 3 pm or if no answer, call [phone]

## 2016-09-05 NOTE — Telephone Encounter (Signed)
Called and spoke with pt and he stated that he feels more tired with the change of the pressure.  He would like to go back to the original setting of the cpap with his sleep test.    Pt also stated that he is currently with [facility] and is not very excited about their services.  He would like to change to [facility].    AD please advise about these requests.  Thanks

## 2016-09-06 NOTE — Telephone Encounter (Signed)
Pt aware of below message.  Order placed. Pt voiced understanding & had no further questions. Nothing further needed.

## 2016-09-06 NOTE — Telephone Encounter (Signed)
Lmtcb. Will await call back 

## 2016-09-06 NOTE — Telephone Encounter (Signed)
Patient is returning call, CB is [phone].

## 2016-09-06 NOTE — Telephone Encounter (Signed)
lmtcb x2 for pt. 

## 2016-09-07 ENCOUNTER — Telehealth: Payer: Self-pay | Admitting: Internal Medicine

## 2016-09-07 ENCOUNTER — Ambulatory Visit (AMBULATORY_SURGERY_CENTER): Payer: Self-pay | Admitting: *Deleted

## 2016-09-07 VITALS — Ht 67.0 in | Wt 162.0 lb

## 2016-09-07 DIAGNOSIS — Z1211 Encounter for screening for malignant neoplasm of colon: Secondary | ICD-10-CM

## 2016-09-07 MED ORDER — NA SULFATE-K SULFATE-MG SULF 17.5-3.13-1.6 GM/177ML PO SOLN: 1.0000 | Freq: Once | ORAL | 0 refills | Status: AC

## 2016-09-07 NOTE — Progress Notes (Signed)
No egg or soy allergy known to patient  No issues with past sedation with any surgeries  or procedures, no intubation problems  No diet pills per patient No home 02 use per patient  No blood thinners per patient  Pt denies issues with constipation  No A fib or A flutter   

## 2016-09-07 NOTE — Telephone Encounter (Signed)
Pt questioned can he have hemorrhoids removed at his colonoscopy. Informed we do not do that during the procedure, but Dr Hilarie Fredrickson may can do banding in the office, instructed to mention day of colon, if large may have a surgical ref. All per Dr Mikki Santee PV

## 2016-09-11 ENCOUNTER — Telehealth: Payer: Self-pay | Admitting: Pulmonary Disease

## 2016-09-11 DIAGNOSIS — G4733 Obstructive sleep apnea (adult) (pediatric): Secondary | ICD-10-CM

## 2016-09-11 NOTE — Telephone Encounter (Signed)
Called [facility] and spoke with Corene Cornea about this order. He states that they are currently working on this order. There are some issues with switching home care companies. Pt is aware of this information. He was upset that this is taking so long. Pt asks that we cancel the order to have DME change and just send an order to [facility] to have his pressure settings changed. These orders have been placed per the pt's request. Nothing further was needed.

## 2016-09-14 ENCOUNTER — Encounter: Payer: Self-pay | Admitting: Pulmonary Disease

## 2016-09-26 ENCOUNTER — Telehealth: Payer: Self-pay | Admitting: Pulmonary Disease

## 2016-09-26 NOTE — Telephone Encounter (Signed)
   DL last month : 93%, AHI 0.3 autocpap 5-15 cm water.  pls tell pt cpap machine is working and his osa is corrected.  Thanks.  Monica Becton, MD 09/26/2016, 9:58 PM [place] [facility] Pager [phone] After 3 pm or if no answer, call [phone]

## 2016-09-27 NOTE — Telephone Encounter (Signed)
Spoke with pt about his results. He stated he would call us if had further questions. Nothing further needed at this time.

## 2016-09-27 NOTE — Telephone Encounter (Signed)
LMOMTCB x 1 

## 2016-09-28 ENCOUNTER — Encounter: Payer: Self-pay | Admitting: Internal Medicine

## 2016-09-28 ENCOUNTER — Ambulatory Visit: Payer: BLUE CROSS/BLUE SHIELD | Admitting: Pulmonary Disease

## 2016-10-03 ENCOUNTER — Telehealth: Payer: Self-pay | Admitting: Internal Medicine

## 2016-10-03 NOTE — Telephone Encounter (Signed)
Suprep bowel prep called into [facility] on  [address] in [place]. Pt was notified. He will call back if he has further questions.

## 2016-10-04 ENCOUNTER — Encounter: Payer: BLUE CROSS/BLUE SHIELD | Admitting: Internal Medicine

## 2016-10-11 ENCOUNTER — Encounter: Payer: Self-pay | Admitting: Internal Medicine

## 2016-10-11 ENCOUNTER — Ambulatory Visit (AMBULATORY_SURGERY_CENTER): Payer: BLUE CROSS/BLUE SHIELD | Admitting: Internal Medicine

## 2016-10-11 VITALS — BP 100/54 | HR 65 | Temp 98.2°F | Resp 11 | Ht 67.0 in | Wt 162.0 lb

## 2016-10-11 DIAGNOSIS — Z1211 Encounter for screening for malignant neoplasm of colon: Secondary | ICD-10-CM

## 2016-10-11 DIAGNOSIS — Z1212 Encounter for screening for malignant neoplasm of rectum: Secondary | ICD-10-CM | POA: Diagnosis not present

## 2016-10-11 MED ORDER — SODIUM CHLORIDE 0.9 % IV SOLN: 500.0000 mL | INTRAVENOUS | Status: DC

## 2016-10-11 NOTE — Op Note (Signed)
[facility] Patient Name: Ricardo Walker Procedure Date: 10/11/2016 1:58 PM MRN: [id number] Endoscopist: Jerene Bears , MD Age: 65 Referring MD:  Date of Birth: 21-Oct-1951 Gender: Male Account #: [id number] Procedure:                Colonoscopy Indications:              Screening for colorectal malignant neoplasm, Last                            colonoscopy 10 years ago Medicines:                Monitored Anesthesia Care Procedure:                Pre-Anesthesia Assessment:                           - Prior to the procedure, a History and Physical                            was performed, and patient medications and                            allergies were reviewed. The patient's tolerance of                            previous anesthesia was also reviewed. The risks                            and benefits of the procedure and the sedation                            options and risks were discussed with the patient.                            All questions were answered, and informed consent                            was obtained. Prior Anticoagulants: The patient has                            taken no previous anticoagulant or antiplatelet                            agents. ASA Grade Assessment: II - A patient with                            mild systemic disease. After reviewing the risks                            and benefits, the patient was deemed in                            satisfactory condition to undergo the procedure.  After obtaining informed consent, the colonoscope                            was passed under direct vision. Throughout the                            procedure, the patient's blood pressure, pulse, and                            oxygen saturations were monitored continuously. The                            Model CF-HQ190L [id number]) scope was introduced                            through the anus and advanced to the  the cecum,                            identified by appendiceal orifice and ileocecal                            valve. The colonoscopy was performed without                            difficulty. The patient tolerated the procedure                            well. The quality of the bowel preparation was                            good. The ileocecal valve, appendiceal orifice, and                            rectum were photographed. The bowel preparation                            used was 2 day Suprep. Scope In: 2:19:09 PM Scope Out: 2:30:58 PM Scope Withdrawal Time: 0 hours 8 minutes 44 seconds  Total Procedure Duration: 0 hours 11 minutes 49 seconds  Findings:                 The digital rectal exam was normal.                           Multiple small-mouthed diverticula were found in                            the sigmoid colon.                           Internal hemorrhoids were found during retroflexion                            and during perianal exam. The hemorrhoids were  small.                           The exam was otherwise without abnormality. Complications:            No immediate complications. Estimated Blood Loss:     Estimated blood loss: none. Impression:               - Mild diverticulosis in the sigmoid colon.                           - Internal hemorrhoids.                           - The examination was otherwise normal.                           - No specimens collected. Recommendation:           - Patient has a contact number available for                            emergencies. The signs and symptoms of potential                            delayed complications were discussed with the                            patient. Return to normal activities tomorrow.                            Written discharge instructions were provided to the                            patient.                           - Resume previous diet.                            - Continue present medications.                           - Repeat colonoscopy in 10 years for screening                            purposes.                           - If internal hemorrhoids are troublesome                            (bleeding, prolapse, painful), banding visits can                            be scheduled for treatment. Jerene Bears, MD 10/11/2016 2:34:45 PM This report has been signed electronically.

## 2016-10-11 NOTE — Progress Notes (Signed)
A/ox3 pleased with MAC, report to Sarah RN 

## 2016-10-11 NOTE — Patient Instructions (Signed)
YOU HAD AN ENDOSCOPIC PROCEDURE TODAY AT THE [facility]:   Refer to the procedure report that was given to you for any specific questions about what was found during the examination.  If the procedure report does not answer your questions, please call your gastroenterologist to clarify.  If you requested that your care partner not be given the details of your procedure findings, then the procedure report has been included in a sealed envelope for you to review at your convenience later.  YOU SHOULD EXPECT: Some feelings of bloating in the abdomen. Passage of more gas than usual.  Walking can help get rid of the air that was put into your GI tract during the procedure and reduce the bloating. If you had a lower endoscopy (such as a colonoscopy or flexible sigmoidoscopy) you may notice spotting of blood in your stool or on the toilet paper. If you underwent a bowel prep for your procedure, you may not have a normal bowel movement for a few days.  Please Note:  You might notice some irritation and congestion in your nose or some drainage.  This is from the oxygen used during your procedure.  There is no need for concern and it should clear up in a day or so.  SYMPTOMS TO REPORT IMMEDIATELY:   Following lower endoscopy (colonoscopy or flexible sigmoidoscopy):  Excessive amounts of blood in the stool  Significant tenderness or worsening of abdominal pains  Swelling of the abdomen that is new, acute  Fever of 100F or higher   For urgent or emergent issues, a gastroenterologist can be reached at any hour by calling [phone]. Please read all handouts given to you by your recovery nurse.   DIET:  We do recommend a small meal at first, but then you may proceed to your regular diet.  Drink plenty of fluids but you should avoid alcoholic beverages for 24 hours.  ACTIVITY:  You should plan to take it easy for the rest of today and you should NOT DRIVE or use heavy machinery until  tomorrow (because of the sedation medicines used during the test).    FOLLOW UP: Our staff will call the number listed on your records the next business day following your procedure to check on you and address any questions or concerns that you may have regarding the information given to you following your procedure. If we do not reach you, we will leave a message.  However, if you are feeling well and you are not experiencing any problems, there is no need to return our call.  We will assume that you have returned to your regular daily activities without incident.  If any biopsies were taken you will be contacted by phone or by letter within the next 1-3 weeks.  Please call us at [phone] if you have not heard about the biopsies in 3 weeks.    SIGNATURES/CONFIDENTIALITY: You and/or your care partner have signed paperwork which will be entered into your electronic medical record.  These signatures attest to the fact that that the information above on your After Visit Summary has been reviewed and is understood.  Full responsibility of the confidentiality of this discharge information lies with you and/or your care-partner.  Thank you for letting us take care of your healthcare needs today. 

## 2016-10-12 ENCOUNTER — Telehealth: Payer: Self-pay

## 2016-10-12 NOTE — Telephone Encounter (Signed)
  Follow up Call-  Call back number 10/11/2016  Post procedure Call Back phone  # [phone]  Permission to leave phone message Yes  Some recent data might be hidden     Patient questions:  Do you have a fever, pain , or abdominal swelling? No. Pain Score  0 *  Have you tolerated food without any problems? Yes.    Have you been able to return to your normal activities? Yes.    Do you have any questions about your discharge instructions: Diet   No. Medications  No. Follow up visit  No.  Do you have questions or concerns about your Care? No.  Actions: * If pain score is 4 or above: No action needed, pain <4.

## 2016-10-12 NOTE — Telephone Encounter (Signed)
  Follow up Call-  Call back number 10/11/2016  Post procedure Call Back phone  # [phone]  Permission to leave phone message Yes  Some recent data might be hidden    Patient was called for follow up after his procedure on 10/11/2016. No answer at the number given for follow up phone call. A message was left on the answering machine.

## 2016-10-27 ENCOUNTER — Ambulatory Visit: Payer: BLUE CROSS/BLUE SHIELD | Admitting: Pulmonary Disease

## 2016-12-04 ENCOUNTER — Ambulatory Visit: Payer: BLUE CROSS/BLUE SHIELD | Admitting: Pulmonary Disease

## 2016-12-29 ENCOUNTER — Ambulatory Visit: Payer: BLUE CROSS/BLUE SHIELD | Admitting: Pulmonary Disease

## 2017-01-30 ENCOUNTER — Encounter: Payer: Self-pay | Admitting: Pulmonary Disease

## 2017-01-30 ENCOUNTER — Ambulatory Visit (INDEPENDENT_AMBULATORY_CARE_PROVIDER_SITE_OTHER): Payer: Medicare HMO | Admitting: Pulmonary Disease

## 2017-01-30 DIAGNOSIS — G4733 Obstructive sleep apnea (adult) (pediatric): Secondary | ICD-10-CM | POA: Diagnosis not present

## 2017-01-30 DIAGNOSIS — G4700 Insomnia, unspecified: Secondary | ICD-10-CM

## 2017-01-30 NOTE — Assessment & Plan Note (Signed)
Patient has chronic insomnia. During the days that he is not able to sleep, he is miserable during the daytime. This is a chronic problem. Likely related to depression, anxiety, stress. I think he needs to see a psychiatrist or psychologist and most likely needs cognitive behavioral therapy.  I recommended for [facility] to call [facility].  I gave him the number to call.

## 2017-01-30 NOTE — Progress Notes (Signed)
Subjective:    Patient ID: Ricardo Walker, male    DOB: Nov 01, 1950, 66 y.o.   MRN: [id number]  HPI   Patient is in the office as follow-up on his sleep apnea. He was last seen by Dr. Gwenette Greet on 11/2014 for OSA. He had a HST in 08/2014 which showed AHI of 21. He has an autocpap.  He has had mask issues. He has tried several masks and the only one working finally was Lawyer. The last couple months however, team where it doesn't seem to be working as well. He does not remember trying a full face mask. Patient also has issues with his CPAP per se. He is very uncomfortable using it. Gets claustrophobic. He is not sure if the pressures too much. Because of his issues with his CPAP and the mass, he gets poor quality sleep. He has frequent awakenings, has hypersomnia, unrefreshed sleep. He gets very moody and irritable June the daytime because of this.  He goes to bed anywhere  from 11pm -1 am. Gets up at 7:30am. Sleeps 6-7 hrs/night. Very exhausted.  He denies being depressed. Denies anxiety.    He tried getting an oral device. He had gone to Dr. Merla Riches at [facility] as well as Dr. Ron Parker in [place]. Both times, his insurance denied his oral device. He currently has a Education officer, museum.   ROV 01/30/2017 Patient is here for f/u on his OSA.  Since last seen, he ended up getting an oral device in 09/2016.  He has been seeing Dr. Ron Parker since December. He was able to obtain an oral device with his insurance covering it. His device was adjusted accordingly until  His symptoms improved. He uses his oral device. Feels better using it. More energy. Less sleepiness. He had a sleep study with an oral device done on 01/04/2017 and his AHI was 3.5. He has had cpap machine x 2 years and he is currently not using it.  He remains to have insomnia which is chronic. He has a lot of stress, as well as mental health issues.   Review of Systems  Constitutional: Negative.   HENT: Negative.   Eyes: Negative.     Respiratory: Negative.   Cardiovascular: Negative.   Endocrine: Negative.   Genitourinary: Negative.   Musculoskeletal: Negative.   Skin: Negative.   Allergic/Immunologic: Negative.   Neurological: Negative.   Hematological: Negative.   Psychiatric/Behavioral: Negative.   All other systems reviewed and are negative.       Objective:   Physical Exam   Vitals:  Vitals:   01/30/17 1211  BP: 110/68  Pulse: 78  SpO2: 98%  Weight: 165 lb 3.2 oz (74.9 kg)  Height: 5\' 7"  (1.702 m)    Constitutional/General:  Pleasant, well-nourished, well-developed, not in any distress,  Comfortably seating.  Well kempt  Body mass index is 25.87 kg/m. Wt Readings from Last 3 Encounters:  01/30/17 165 lb 3.2 oz (74.9 kg)  10/11/16 162 lb (73.5 kg)  09/07/16 162 lb (73.5 kg)     HEENT: Pupils equal and reactive to light and accommodation. Anicteric sclerae. Normal nasal mucosa.   No oral  lesions,  mouth clear,  oropharynx clear, no postnasal drip. (-) Oral thrush. No dental caries. Mild retrognathia.  Airway - Mallampati class II  Neck: No masses. Midline trachea. No JVD, (-) LAD. (-) bruits appreciated.  Respiratory/Chest: Grossly normal chest. (-) deformity. (-) Accessory muscle use.  Symmetric expansion. (-) Tenderness on palpation.  Resonant on percussion.  Diminished BS on both lower lung zones. (-) wheezing, crackles, rhonchi (-) egophony  Cardiovascular: Regular rate and  rhythm, heart sounds normal, no murmur or gallops, no peripheral edema  Gastrointestinal:  Normal bowel sounds. Soft, non-tender. No hepatosplenomegaly.  (-) masses.   Musculoskeletal:  Normal muscle tone. Normal gait.   Extremities: Grossly normal. (-) clubbing, cyanosis.  (-) edema  Skin: (-) rash,lesions seen.   Neurological/Psychiatric : alert, oriented to time, place, person. Normal mood and affect         Assessment & Plan:  OSA (obstructive sleep apnea) Patient had home sleep test  in November for 2015 which showed AHI of 21. He has an auto CPAP, 5-15 cm water.  Patient has had issues tolerating his CPAP. He was last seen by Dr. Gwenette Greet in 11/2014. Since that time, he went to Dr. Merla Riches at [facility] for oral device. His oral device was not approved by his insurance. He also went to Dr. Ron Parker in [place]. Oral device was not approved.  I saw patient in October 2017. He had a new insurance then and was able to get an oral device care of Dr. Oneal Grout in December 2017. Dr. Ron Parker has been adjusting his oral device until his symptoms were improved. He had a home sleep study on his oral device on 01/04/2017 and his AHI was 3.5.  He is tolerating his oral device.  Plan : 1. Continue with oral device. He follows up with Dr. Ron Parker every 6 months. 2. Hold off on CPAP use. He obtained his current CPAP in 2015 and it should be paid for already. I told him to keep his machine has a back up just in case something happens to his oral device.   Insomnia Patient has chronic insomnia. During the days that he is not able to sleep, he is miserable during the daytime. This is a chronic problem. Likely related to depression, anxiety, stress. I think he needs to see a psychiatrist or psychologist and most likely needs cognitive behavioral therapy.  I recommended for [facility] to call [facility].  I gave him the number to call.     Return to clinic as needed.       Monica Becton, MD 01/30/2017, 1:14 PM [facility] Pager [phone] After 3 pm or if no answer, call [phone]

## 2017-01-30 NOTE — Assessment & Plan Note (Signed)
Patient had home sleep test in November for 2015 which showed AHI of 21. He has an auto CPAP, 5-15 cm water.  Patient has had issues tolerating his CPAP. He was last seen by Dr. Gwenette Greet in 11/2014. Since that time, he went to Dr. Merla Riches at [facility] for oral device. His oral device was not approved by his insurance. He also went to Dr. Ron Parker in [place]. Oral device was not approved.  I saw patient in October 2017. He had a new insurance then and was able to get an oral device care of Dr. Oneal Grout in December 2017. Dr. Ron Parker has been adjusting his oral device until his symptoms were improved. He had a home sleep study on his oral device on 01/04/2017 and his AHI was 3.5.  He is tolerating his oral device.  Plan : 1. Continue with oral device. He follows up with Dr. Ron Parker every 6 months. 2. Hold off on CPAP use. He obtained his current CPAP in 2015 and it should be paid for already. I told him to keep his machine has a back up just in case something happens to his oral device.

## 2017-02-14 ENCOUNTER — Institutional Professional Consult (permissible substitution): Payer: Self-pay | Admitting: Neurology

## 2017-03-09 DIAGNOSIS — H52222 Regular astigmatism, left eye: Secondary | ICD-10-CM | POA: Diagnosis not present

## 2017-03-09 DIAGNOSIS — H5203 Hypermetropia, bilateral: Secondary | ICD-10-CM | POA: Diagnosis not present

## 2017-03-09 DIAGNOSIS — H524 Presbyopia: Secondary | ICD-10-CM | POA: Diagnosis not present

## 2017-03-13 DIAGNOSIS — Z0101 Encounter for examination of eyes and vision with abnormal findings: Secondary | ICD-10-CM | POA: Diagnosis not present

## 2017-11-19 DIAGNOSIS — G4719 Other hypersomnia: Secondary | ICD-10-CM | POA: Diagnosis not present

## 2017-11-19 DIAGNOSIS — G4733 Obstructive sleep apnea (adult) (pediatric): Secondary | ICD-10-CM | POA: Diagnosis not present

## 2017-11-19 DIAGNOSIS — Z23 Encounter for immunization: Secondary | ICD-10-CM | POA: Diagnosis not present

## 2017-11-19 DIAGNOSIS — R69 Illness, unspecified: Secondary | ICD-10-CM | POA: Diagnosis not present

## 2017-11-28 DIAGNOSIS — Z0000 Encounter for general adult medical examination without abnormal findings: Secondary | ICD-10-CM | POA: Diagnosis not present

## 2018-03-18 DIAGNOSIS — R69 Illness, unspecified: Secondary | ICD-10-CM | POA: Diagnosis not present

## 2018-03-18 DIAGNOSIS — G4719 Other hypersomnia: Secondary | ICD-10-CM | POA: Diagnosis not present

## 2018-03-27 DIAGNOSIS — R69 Illness, unspecified: Secondary | ICD-10-CM | POA: Diagnosis not present

## 2018-03-27 DIAGNOSIS — S80862A Insect bite (nonvenomous), left lower leg, initial encounter: Secondary | ICD-10-CM | POA: Diagnosis not present

## 2018-03-27 DIAGNOSIS — K649 Unspecified hemorrhoids: Secondary | ICD-10-CM | POA: Diagnosis not present

## 2018-03-27 DIAGNOSIS — S30860A Insect bite (nonvenomous) of lower back and pelvis, initial encounter: Secondary | ICD-10-CM | POA: Diagnosis not present

## 2018-03-27 DIAGNOSIS — W57XXXA Bitten or stung by nonvenomous insect and other nonvenomous arthropods, initial encounter: Secondary | ICD-10-CM | POA: Diagnosis not present

## 2018-05-27 IMAGING — MR MR HEAD WO/W CM
11 of 13 series · 38 of 48 positions shown · IV contrast (multihance)
Comparison: MRI 07/22/2014

CLINICAL DATA: Supraorbital headache.  Meningioma resection 6760

Creatinine was obtained on site at [HOSPITAL] at [HOSPITAL].
Results: Creatinine 0.8 mg/dL.
EXAM:
MRI HEAD WITHOUT AND WITH CONTRAST
TECHNIQUE: Multiplanar, multiecho pulse sequences of the brain and surrounding
structures were obtained without and with intravenous contrast.
CONTRAST:  14 mL MultiHance IV

[Series 2: T1 · sagittal · 5.0mm · 0.45mm/px · 1 of 21 slices shown (1 of 2)]
[im 1/21]
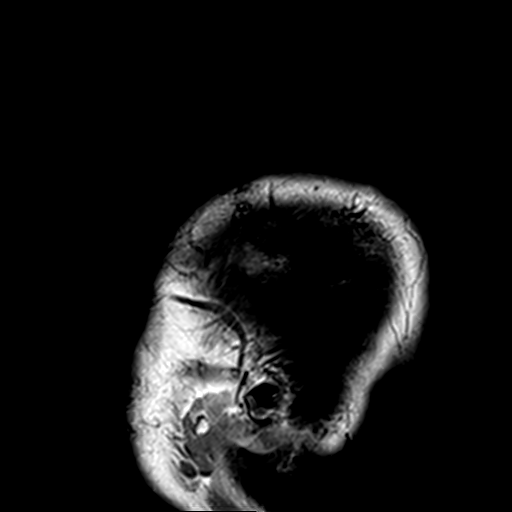

[Series 3: DWI · axial · 3.0mm · 0.94mm/px · z∈[-28,+103]mm · 7 of 96 slices shown (1 of 2)]
[im 1/96]
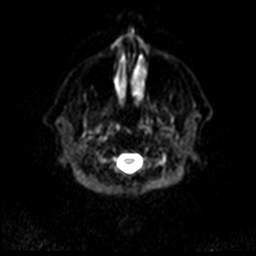
[im 16/96]
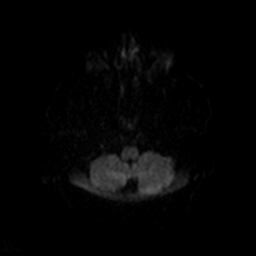
[im 32/96]
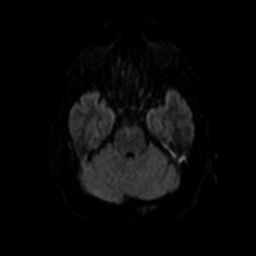
[im 48/96]
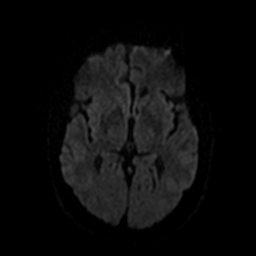
[im 64/96]
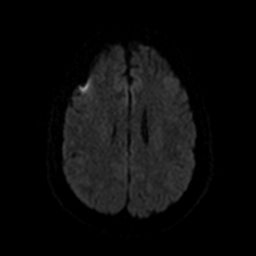
[im 80/96]
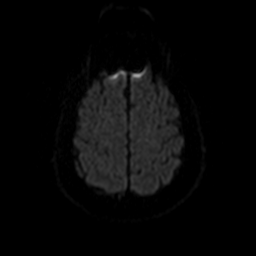
[im 96/96]
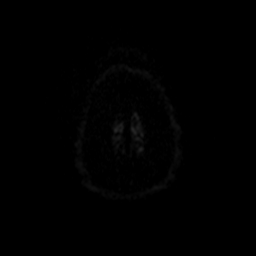

[Series 4: dwi_adc · axial · 3.0mm · 0.94mm/px · z∈[-28,+103]mm · 4 of 47 slices shown]
[im 1/47]
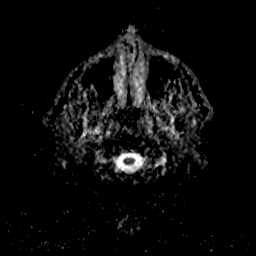
[im 16/47]
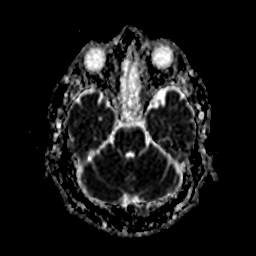
[im 31/47]
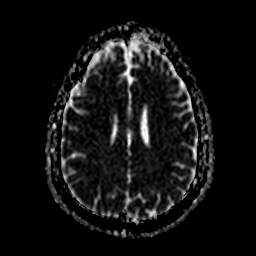
[im 47/47]
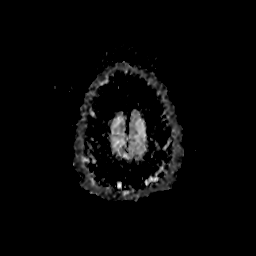

[Series 5: DWI · coronal · 5.0mm · 0.90mm/px · 5 of 71 slices shown (2 of 2)]
[im 1/71]
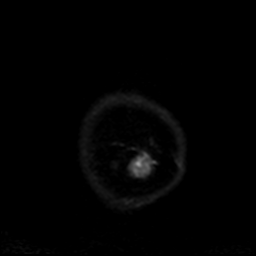
[im 18/71]
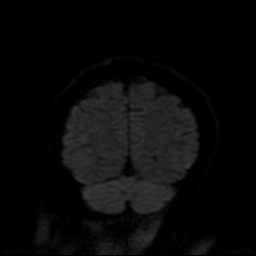
[im 36/71]
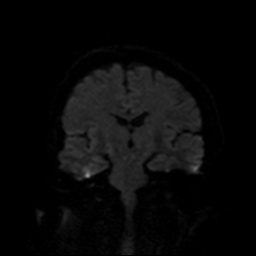
[im 53/71]
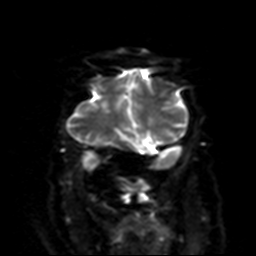
[im 71/71]
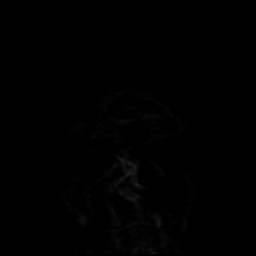

[Series 6: cor dwi_adc · coronal · 5.0mm · 0.90mm/px · 3 of 36 slices shown]
[im 1/36]
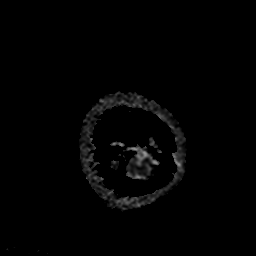
[im 18/36]
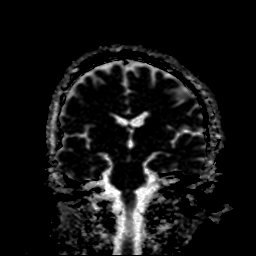
[im 36/36]
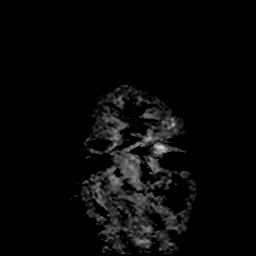

[Series 7: T2 · axial · 5.0mm · 0.51mm/px · z∈[-26,+101]mm · 2 of 22 slices shown (1 of 2)]
[im 1/22]
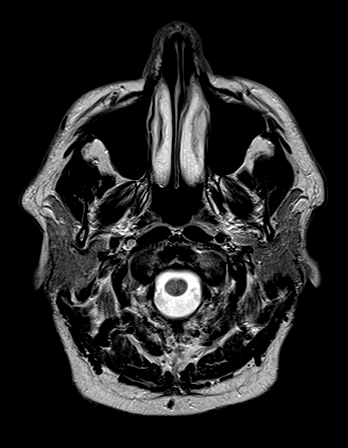
[im 22/22]
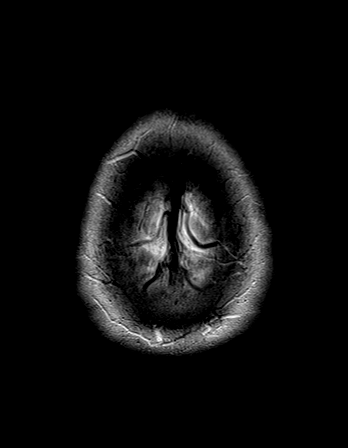

[Series 8: FLAIR · axial · 5.0mm · 0.45mm/px · z∈[-26,+101]mm · 2 of 22 slices shown]
[im 1/22]
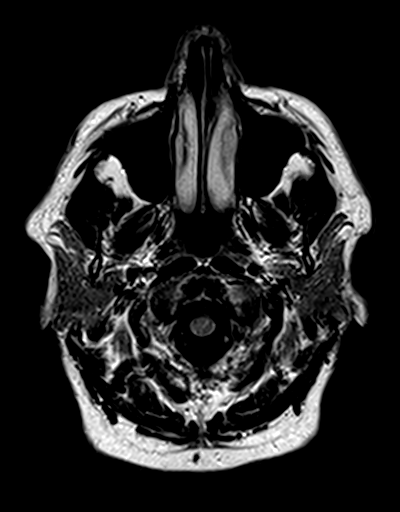
[im 22/22]
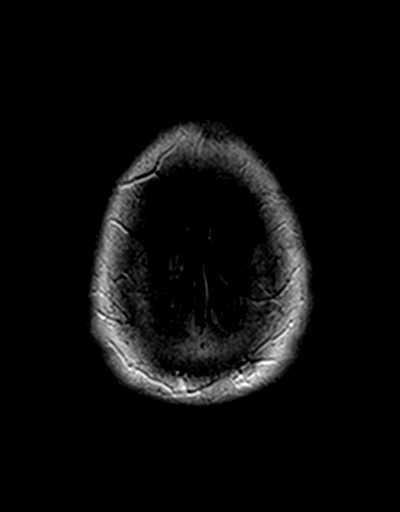

[Series 9: axial (person_name)1 volume · axial · 2.0mm · 0.45mm/px · z∈[-36,+111]mm · 6 of 80 slices shown]
[im 1/80]
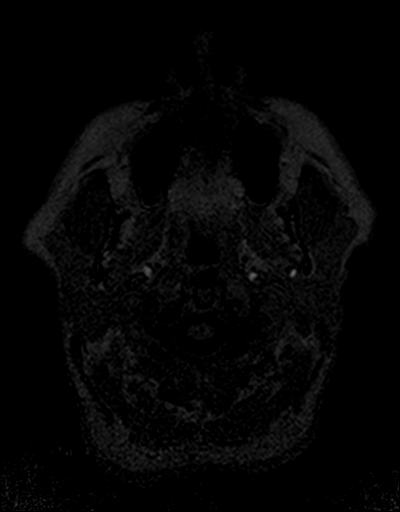
[im 16/80]
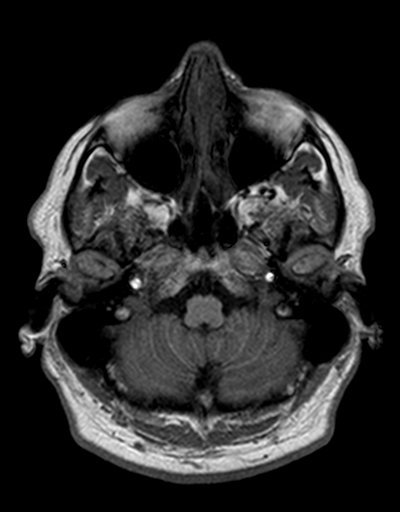
[im 32/80]
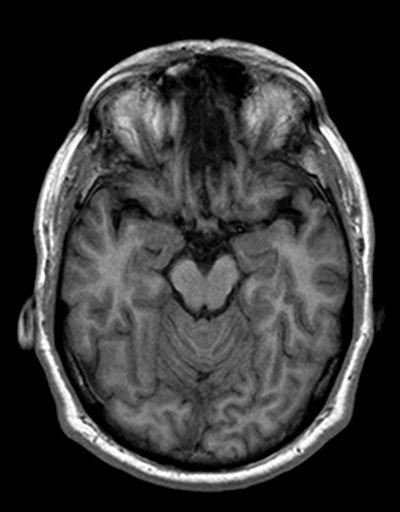
[im 48/80]
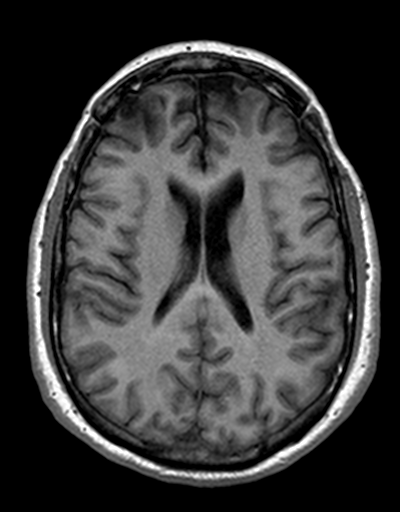
[im 64/80]
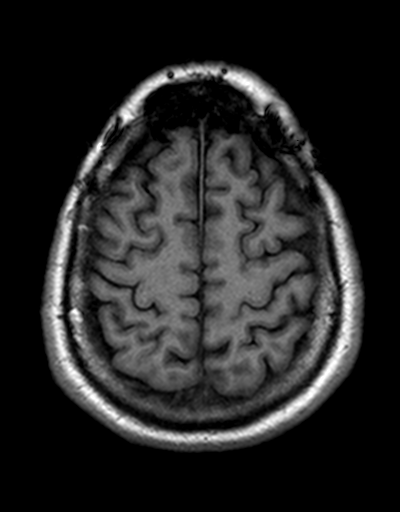
[im 80/80]
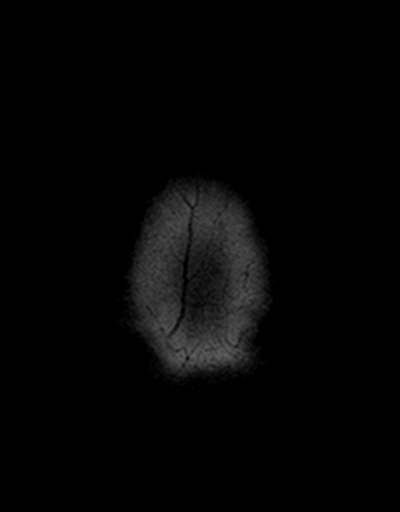

[Series 11: swi_images · axial · 2.0mm · 0.90mm/px · z∈[-36,+51]mm · 4 of 80 slices shown]
[im 1/80]
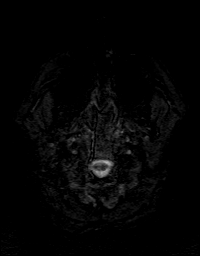
[im 16/80]
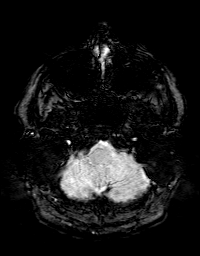
[im 32/80]
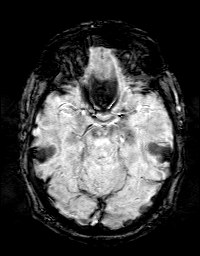
[im 48/80]
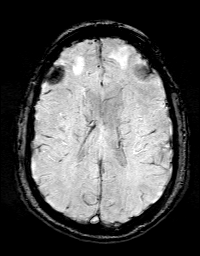

[Series 12: T2 · coronal · 5.0mm · 0.45mm/px · 2 of 27 slices shown (2 of 2)]
[im 1/27]
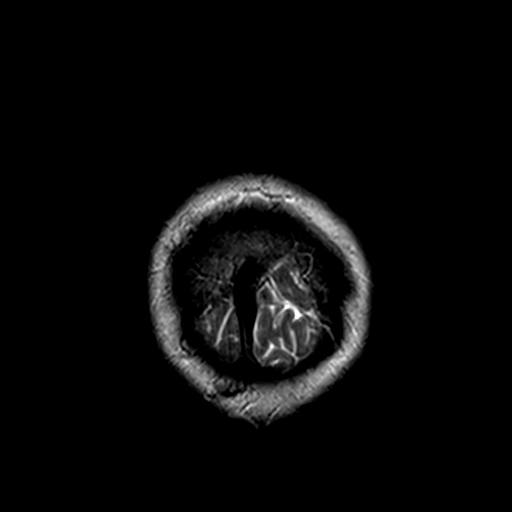
[im 27/27]
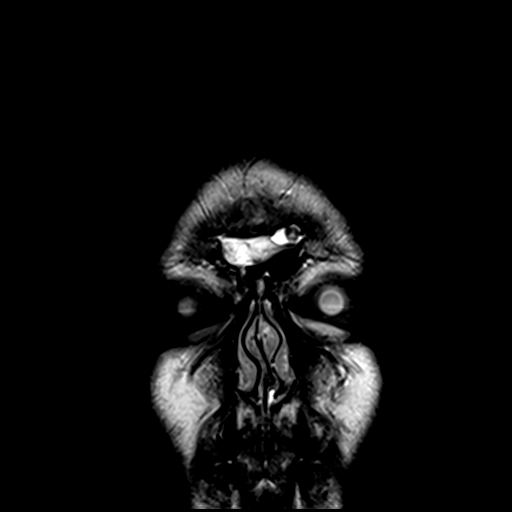

[Series 15: T1 · sagittal · 5.0mm · 0.45mm/px · 2 of 21 slices shown (2 of 2)]
[im 1/21]
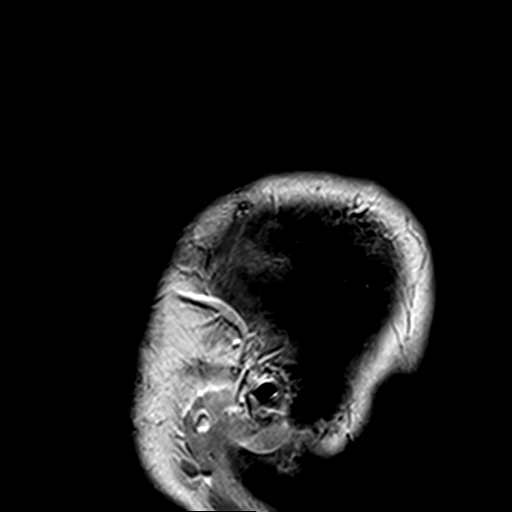
[im 21/21]
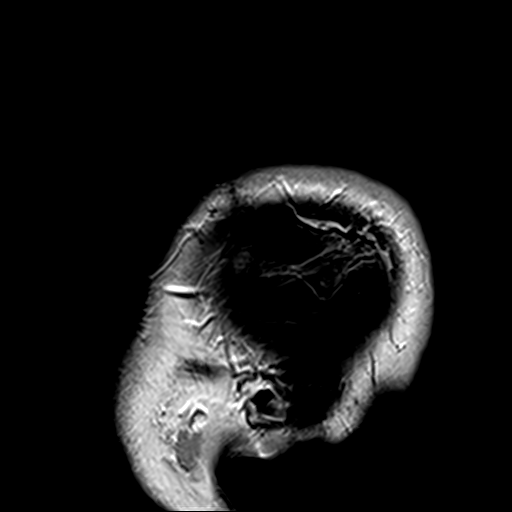

[38 of 48 positions shown; findings below may reference images not displayed]

FINDINGS: Brain: Postop bifrontal craniotomy. Anterior and subfrontal
encephalomalacia bilaterally is unchanged from the prior MRI.
Following contrast infusion, there is thin linear enhancement above
the orbital roof medially on the right, unchanged from the prior
study. This is most compatible with postop enhancement versus small
residual meningioma which is stable. No new area of enhancing tumor
identified.

Ventricle size normal. Negative for acute infarct. Remainder the
cerebral white matter normal. Brainstem and cerebellum normal.

Postcontrast imaging reveals no other areas of abnormal enhancement.

Vascular: Normal flow voids.

Skull and upper cervical spine: Bifrontal craniotomy changes

Sinuses/Orbits: Negative

Other: Negative
IMPRESSION: Stable MRI of the brain. Postop meningioma resection in the
subfrontal region. Small amount of linear enhancement medial to the
right orbit is unchanged.

## 2018-05-28 ENCOUNTER — Ambulatory Visit: Payer: Self-pay | Admitting: General Surgery

## 2018-05-28 NOTE — H&P (Signed)
History of Present Illness Leighton Ruff MD; 04/26/3150 9:34 AM) The patient is a 67 year old male who presents with hemorrhoids. 67 year old male who presents to the office for evaluation of anal pain. He states that he has an anal skin tag that becomes irritated with multiple bowel movements. He occasionally notices some blood on toilet paper as well. He reports regular bowel movements and denies any constipation or straining. His up-to-date on his colonoscopies. His last one was less than 2 years ago and normal.   Past Surgical History (Tanisha A. Owens Shark, [place]; 05/28/2018 9:25 AM) Foot Surgery Left. Knee Surgery Right. Laparoscopic Inguinal Hernia Surgery Bilateral.  Diagnostic Studies History (Tanisha A. Owens Shark, [place]; 05/28/2018 9:25 AM) Colonoscopy 1-5 years ago  Allergies (Tanisha A. Owens Shark, [place]; 05/28/2018 9:26 AM) No Known Drug Allergies [05/28/2018]: Allergies Reconciled  Medication History (Tanisha A. Owens Shark, RMA; 05/28/2018 9:27 AM) Modafinil (100MG  Tablet, Oral) Active. One Daily Adults 50+ (Oral) Active. Triazolam (0.25MG  Tablet, Oral) Active. Glucosamine (500MG  Capsule, Oral) Active. Medications Reconciled  Social History (Tanisha A. Owens Shark, [place]; 05/28/2018 9:25 AM) Alcohol use Occasional alcohol use. Caffeine use Coffee. No drug use Tobacco use Former smoker.  Family History (Tanisha A. Owens Shark, [place]; 05/28/2018 9:25 AM) Alcohol Abuse Mother. Depression Mother. Malignant Neoplasm Of Pancreas Family Members In General. Migraine Headache Brother.  Other Problems (Tanisha A. Owens Shark, [place]; 05/28/2018 9:25 AM) Back Pain Hemorrhoids Hypercholesterolemia Inguinal Hernia Sleep Apnea     Review of Systems (Tanisha A. Brown RMA; 05/28/2018 9:25 AM) General Not Present- Appetite Loss, Chills, Fatigue, Fever, Night Sweats, Weight Gain and Weight Loss. Skin Not Present- Change in Wart/Mole, Dryness, Hives, Jaundice, New Lesions, Non-Healing Wounds, Rash  and Ulcer. HEENT Present- Wears glasses/contact lenses. Not Present- Earache, Hearing Loss, Hoarseness, Nose Bleed, Oral Ulcers, Ringing in the Ears, Seasonal Allergies, Sinus Pain, Sore Throat, Visual Disturbances and Yellow Eyes. Respiratory Not Present- Bloody sputum, Chronic Cough, Difficulty Breathing, Snoring and Wheezing. Cardiovascular Not Present- Chest Pain, Difficulty Breathing Lying Down, Leg Cramps, Palpitations, Rapid Heart Rate, Shortness of Breath and Swelling of Extremities. Gastrointestinal Present- Hemorrhoids and Rectal Pain. Not Present- Abdominal Pain, Bloating, Bloody Stool, Change in Bowel Habits, Chronic diarrhea, Constipation, Difficulty Swallowing, Excessive gas, Gets full quickly at meals, Indigestion, Nausea and Vomiting. Male Genitourinary Present- Change in Urinary Stream. Not Present- Blood in Urine, Frequency, Impotence, Nocturia, Painful Urination, Urgency and Urine Leakage.  Vitals (Tanisha A. Brown RMA; 05/28/2018 9:25 AM) 05/28/2018 9:25 AM Weight: 166.4 lb Height: 67in Body Surface Area: 1.87 m Body Mass Index: 26.06 kg/m  Temp.: 98.34F  Pulse: 73 (Regular)  BP: 112/72 (Sitting, Left Arm, Standard)      Physical Exam Leighton Ruff MD; 7/61/6073 9:44 AM)  General Mental Status-Alert. General Appearance-Not in acute distress. Build & Nutrition-Well nourished. Posture-Normal posture. Gait-Normal.  Head and Neck Head-normocephalic, atraumatic with no lesions or palpable masses. Trachea-midline.  Chest and Lung Exam Chest and lung exam reveals -on auscultation, normal breath sounds, no adventitious sounds and normal vocal resonance.  Cardiovascular Cardiovascular examination reveals -normal heart sounds, regular rate and rhythm with no murmurs and no digital clubbing, cyanosis, edema, increased warmth or tenderness.  Abdomen Inspection Inspection of the abdomen reveals - No Hernias. Palpation/Percussion Palpation  and Percussion of the abdomen reveal - Soft, Non Tender, No Rigidity (guarding), No hepatosplenomegaly and No Palpable abdominal masses.  Neurologic Neurologic evaluation reveals -alert and oriented x 3 with no impairment of recent or remote memory, normal attention span and ability to concentrate, normal sensation  and normal coordination.  Musculoskeletal Normal Exam - Bilateral-Upper Extremity Strength Normal and Lower Extremity Strength Normal.    Assessment & Plan Leighton Ruff MD; 7/70/3403 9:46 AM)  ANAL SKIN TAG (K64.4) Impression: 67 year old male with an anterior anal skin tag which causes him chronic irritation. On exam he has signs of this chronic irritation. I think there is a good chance that his symptoms could be improved by removing the skin tag. We have discussed that resection can be painful and there is no guarantee that he will not develop a new skin tag in the future. There is also no guarantee that this will completely alleviate his symptoms. I believe he understands that and wishes to proceed with surgery.

## 2018-05-28 NOTE — H&P (View-Only) (Signed)
History of Present Illness Ricardo Ruff MD; 02/12/8308 9:34 AM) The patient is a 67 year old male who presents with hemorrhoids. 67 year old male who presents to the office for evaluation of anal pain. He states that he has an anal skin tag that becomes irritated with multiple bowel movements. He occasionally notices some blood on toilet paper as well. He reports regular bowel movements and denies any constipation or straining. His up-to-date on his colonoscopies. His last one was less than 2 years ago and normal.   Past Surgical History (Tanisha A. Owens Shark, [place]; 05/28/2018 9:25 AM) Foot Surgery Left. Knee Surgery Right. Laparoscopic Inguinal Hernia Surgery Bilateral.  Diagnostic Studies History (Tanisha A. Owens Shark, [place]; 05/28/2018 9:25 AM) Colonoscopy 1-5 years ago  Allergies (Tanisha A. Owens Shark, [place]; 05/28/2018 9:26 AM) No Known Drug Allergies [05/28/2018]: Allergies Reconciled  Medication History (Tanisha A. Owens Shark, [place]; 05/28/2018 9:27 AM) Modafinil (100MG  Tablet, Oral) Active. One Daily Adults 50+ (Oral) Active. Triazolam (0.25MG  Tablet, Oral) Active. Glucosamine (500MG  Capsule, Oral) Active. Medications Reconciled  Social History (Tanisha A. Owens Shark, [place]; 05/28/2018 9:25 AM) Alcohol use Occasional alcohol use. Caffeine use Coffee. No drug use Tobacco use Former smoker.  Family History (Tanisha A. Owens Shark, [place]; 05/28/2018 9:25 AM) Alcohol Abuse Mother. Depression Mother. Malignant Neoplasm Of Pancreas Family Members In General. Migraine Headache Brother.  Other Problems (Tanisha A. Owens Shark, [place]; 05/28/2018 9:25 AM) Back Pain Hemorrhoids Hypercholesterolemia Inguinal Hernia Sleep Apnea     Review of Systems (Tanisha A. Brown RMA; 05/28/2018 9:25 AM) General Not Present- Appetite Loss, Chills, Fatigue, Fever, Night Sweats, Weight Gain and Weight Loss. Skin Not Present- Change in Wart/Mole, Dryness, Hives, Jaundice, New Lesions, Non-Healing Wounds, Rash  and Ulcer. HEENT Present- Wears glasses/contact lenses. Not Present- Earache, Hearing Loss, Hoarseness, Nose Bleed, Oral Ulcers, Ringing in the Ears, Seasonal Allergies, Sinus Pain, Sore Throat, Visual Disturbances and Yellow Eyes. Respiratory Not Present- Bloody sputum, Chronic Cough, Difficulty Breathing, Snoring and Wheezing. Cardiovascular Not Present- Chest Pain, Difficulty Breathing Lying Down, Leg Cramps, Palpitations, Rapid Heart Rate, Shortness of Breath and Swelling of Extremities. Gastrointestinal Present- Hemorrhoids and Rectal Pain. Not Present- Abdominal Pain, Bloating, Bloody Stool, Change in Bowel Habits, Chronic diarrhea, Constipation, Difficulty Swallowing, Excessive gas, Gets full quickly at meals, Indigestion, Nausea and Vomiting. Male Genitourinary Present- Change in Urinary Stream. Not Present- Blood in Urine, Frequency, Impotence, Nocturia, Painful Urination, Urgency and Urine Leakage.  Vitals (Tanisha A. Brown RMA; 05/28/2018 9:25 AM) 05/28/2018 9:25 AM Weight: 166.4 lb Height: 67in Body Surface Area: 1.87 m Body Mass Index: 26.06 kg/m  Temp.: 98.53F  Pulse: 73 (Regular)  BP: 112/72 (Sitting, Left Arm, Standard)      Physical Exam Ricardo Ruff MD; 02/04/6807 9:44 AM)  General Mental Status-Alert. General Appearance-Not in acute distress. Build & Nutrition-Well nourished. Posture-Normal posture. Gait-Normal.  Head and Neck Head-normocephalic, atraumatic with no lesions or palpable masses. Trachea-midline.  Chest and Lung Exam Chest and lung exam reveals -on auscultation, normal breath sounds, no adventitious sounds and normal vocal resonance.  Cardiovascular Cardiovascular examination reveals -normal heart sounds, regular rate and rhythm with no murmurs and no digital clubbing, cyanosis, edema, increased warmth or tenderness.  Abdomen Inspection Inspection of the abdomen reveals - No Hernias. Palpation/Percussion Palpation  and Percussion of the abdomen reveal - Soft, Non Tender, No Rigidity (guarding), No hepatosplenomegaly and No Palpable abdominal masses.  Neurologic Neurologic evaluation reveals -alert and oriented x 3 with no impairment of recent or remote memory, normal attention span and ability to concentrate, normal sensation  and normal coordination.  Musculoskeletal Normal Exam - Bilateral-Upper Extremity Strength Normal and Lower Extremity Strength Normal.    Assessment & Plan Ricardo Ruff MD; 9/57/4734 9:46 AM)  ANAL SKIN TAG (K64.4) Impression: 67 year old male with an anterior anal skin tag which causes him chronic irritation. On exam he has signs of this chronic irritation. I think there is a good chance that his symptoms could be improved by removing the skin tag. We have discussed that resection can be painful and there is no guarantee that he will not develop a new skin tag in the future. There is also no guarantee that this will completely alleviate his symptoms. I believe he understands that and wishes to proceed with surgery.

## 2018-06-11 ENCOUNTER — Encounter (HOSPITAL_BASED_OUTPATIENT_CLINIC_OR_DEPARTMENT_OTHER): Payer: Self-pay

## 2018-06-12 ENCOUNTER — Other Ambulatory Visit: Payer: Self-pay

## 2018-06-12 ENCOUNTER — Encounter (HOSPITAL_BASED_OUTPATIENT_CLINIC_OR_DEPARTMENT_OTHER): Payer: Self-pay | Admitting: *Deleted

## 2018-06-12 NOTE — Progress Notes (Signed)
Pre op interview completed via telephone. Patient has no AM meds to take DOS. NPO after MN. Patient to arrive at 0915.

## 2018-06-19 ENCOUNTER — Encounter (HOSPITAL_BASED_OUTPATIENT_CLINIC_OR_DEPARTMENT_OTHER): Payer: Self-pay | Admitting: *Deleted

## 2018-06-19 ENCOUNTER — Ambulatory Visit (HOSPITAL_BASED_OUTPATIENT_CLINIC_OR_DEPARTMENT_OTHER): Payer: Medicare HMO | Admitting: Anesthesiology

## 2018-06-19 ENCOUNTER — Ambulatory Visit (HOSPITAL_BASED_OUTPATIENT_CLINIC_OR_DEPARTMENT_OTHER)
Admission: RE | Admit: 2018-06-19 | Discharge: 2018-06-19 | Disposition: A | Discharge status: Home / Self Care | Payer: Medicare HMO | Source: Ambulatory Visit | Attending: General Surgery | Admitting: General Surgery

## 2018-06-19 ENCOUNTER — Encounter (HOSPITAL_BASED_OUTPATIENT_CLINIC_OR_DEPARTMENT_OTHER)
Admission: RE | Disposition: A | Discharge status: Home / Self Care | Payer: Self-pay | Source: Ambulatory Visit | Attending: General Surgery

## 2018-06-19 DIAGNOSIS — F419 Anxiety disorder, unspecified: Secondary | ICD-10-CM | POA: Diagnosis not present

## 2018-06-19 DIAGNOSIS — K644 Residual hemorrhoidal skin tags: Secondary | ICD-10-CM | POA: Insufficient documentation

## 2018-06-19 DIAGNOSIS — F329 Major depressive disorder, single episode, unspecified: Secondary | ICD-10-CM | POA: Diagnosis not present

## 2018-06-19 DIAGNOSIS — Z79899 Other long term (current) drug therapy: Secondary | ICD-10-CM | POA: Diagnosis not present

## 2018-06-19 DIAGNOSIS — G4730 Sleep apnea, unspecified: Secondary | ICD-10-CM | POA: Insufficient documentation

## 2018-06-19 DIAGNOSIS — Z9989 Dependence on other enabling machines and devices: Secondary | ICD-10-CM | POA: Diagnosis not present

## 2018-06-19 DIAGNOSIS — Z87891 Personal history of nicotine dependence: Secondary | ICD-10-CM | POA: Diagnosis not present

## 2018-06-19 DIAGNOSIS — E7800 Pure hypercholesterolemia, unspecified: Secondary | ICD-10-CM | POA: Diagnosis not present

## 2018-06-19 HISTORY — DX: Fatty (change of) liver, not elsewhere classified: K76.0

## 2018-06-19 HISTORY — PX: EXCISION OF SKIN TAG: SHX6270

## 2018-06-19 HISTORY — DX: Depression, unspecified: F32.A

## 2018-06-19 HISTORY — DX: Unspecified hemorrhoids: K64.9

## 2018-06-19 HISTORY — DX: Presence of spectacles and contact lenses: Z97.3

## 2018-06-19 HISTORY — DX: Residual hemorrhoidal skin tags: K64.4

## 2018-06-19 HISTORY — DX: Headache, unspecified: R51.9

## 2018-06-19 HISTORY — DX: Insomnia, unspecified: G47.00

## 2018-06-19 HISTORY — DX: Unspecified osteoarthritis, unspecified site: M19.90

## 2018-06-19 HISTORY — DX: Anxiety disorder, unspecified: F41.9

## 2018-06-19 HISTORY — DX: Headache: R51

## 2018-06-19 HISTORY — DX: Major depressive disorder, single episode, unspecified: F32.9

## 2018-06-19 SURGERY — EXCISION, SKIN TAG
Anesthesia: Monitor Anesthesia Care | Site: Rectum

## 2018-06-19 MED ORDER — SODIUM CHLORIDE 0.9 % IV SOLN
250.0000 mL | INTRAVENOUS | Status: DC | PRN
  Filled 2018-06-19: qty 250

## 2018-06-19 MED ORDER — PROPOFOL 10 MG/ML IV BOLUS
INTRAVENOUS | Status: AC
  Filled 2018-06-19: qty 20

## 2018-06-19 MED ORDER — MIDAZOLAM HCL 2 MG/2ML IJ SOLN
INTRAMUSCULAR | Status: AC
  Filled 2018-06-19: qty 2

## 2018-06-19 MED ORDER — ACETAMINOPHEN 650 MG RE SUPP
650.0000 mg | RECTAL | Status: DC | PRN
  Filled 2018-06-19: qty 1

## 2018-06-19 MED ORDER — ONDANSETRON HCL 4 MG/2ML IJ SOLN
4.0000 mg | Freq: Once | INTRAMUSCULAR | Status: DC | PRN
  Filled 2018-06-19: qty 2

## 2018-06-19 MED ORDER — LIDOCAINE 5 % EX OINT
TOPICAL_OINTMENT | CUTANEOUS | Status: DC | PRN
  Administered 2018-06-19: 1

## 2018-06-19 MED ORDER — GABAPENTIN 300 MG PO CAPS
ORAL_CAPSULE | ORAL | Status: AC
  Filled 2018-06-19: qty 1

## 2018-06-19 MED ORDER — FENTANYL CITRATE (PF) 100 MCG/2ML IJ SOLN
INTRAMUSCULAR | Status: DC | PRN
  Administered 2018-06-19: 50 ug via INTRAVENOUS

## 2018-06-19 MED ORDER — OXYCODONE HCL 5 MG PO TABS
5.0000 mg | ORAL_TABLET | ORAL | Status: DC | PRN
  Filled 2018-06-19: qty 2

## 2018-06-19 MED ORDER — MIDAZOLAM HCL 2 MG/2ML IJ SOLN
INTRAMUSCULAR | Status: DC | PRN
  Administered 2018-06-19: 1 mg via INTRAVENOUS

## 2018-06-19 MED ORDER — ACETAMINOPHEN 325 MG PO TABS
650.0000 mg | ORAL_TABLET | ORAL | Status: DC | PRN
  Filled 2018-06-19: qty 2

## 2018-06-19 MED ORDER — ACETAMINOPHEN 500 MG PO TABS
1000.0000 mg | ORAL_TABLET | ORAL | Status: AC
  Administered 2018-06-19: 1000 mg via ORAL
  Filled 2018-06-19: qty 2

## 2018-06-19 MED ORDER — ACETAMINOPHEN 500 MG PO TABS
ORAL_TABLET | ORAL | Status: AC
  Filled 2018-06-19: qty 2

## 2018-06-19 MED ORDER — PROPOFOL 500 MG/50ML IV EMUL
INTRAVENOUS | Status: AC
  Filled 2018-06-19: qty 50

## 2018-06-19 MED ORDER — PROPOFOL 500 MG/50ML IV EMUL
INTRAVENOUS | Status: DC | PRN
  Administered 2018-06-19: 100 ug/kg/min via INTRAVENOUS

## 2018-06-19 MED ORDER — GABAPENTIN 300 MG PO CAPS
300.0000 mg | ORAL_CAPSULE | ORAL | Status: AC
  Administered 2018-06-19: 300 mg via ORAL
  Filled 2018-06-19: qty 1

## 2018-06-19 MED ORDER — SODIUM CHLORIDE 0.9% FLUSH
3.0000 mL | Freq: Two times a day (BID) | INTRAVENOUS | Status: DC
  Filled 2018-06-19: qty 3

## 2018-06-19 MED ORDER — BUPIVACAINE-EPINEPHRINE 0.5% -1:200000 IJ SOLN
INTRAMUSCULAR | Status: DC | PRN
  Administered 2018-06-19: 30 mL

## 2018-06-19 MED ORDER — SODIUM CHLORIDE 0.9% FLUSH
3.0000 mL | INTRAVENOUS | Status: DC | PRN
  Filled 2018-06-19: qty 3

## 2018-06-19 MED ORDER — BUPIVACAINE LIPOSOME 1.3 % IJ SUSP
20.0000 mL | Freq: Once | INTRAMUSCULAR | Status: DC
  Filled 2018-06-19: qty 20

## 2018-06-19 MED ORDER — FENTANYL CITRATE (PF) 100 MCG/2ML IJ SOLN
25.0000 ug | INTRAMUSCULAR | Status: DC | PRN
  Filled 2018-06-19: qty 1

## 2018-06-19 MED ORDER — PROPOFOL 10 MG/ML IV BOLUS
INTRAVENOUS | Status: DC | PRN
  Administered 2018-06-19: 40 mg via INTRAVENOUS

## 2018-06-19 MED ORDER — FENTANYL CITRATE (PF) 100 MCG/2ML IJ SOLN
INTRAMUSCULAR | Status: AC
  Filled 2018-06-19: qty 2

## 2018-06-19 MED ORDER — LACTATED RINGERS IV SOLN
INTRAVENOUS | Status: DC
  Administered 2018-06-19: 10:00:00 via INTRAVENOUS
  Filled 2018-06-19: qty 1000

## 2018-06-19 SURGICAL SUPPLY — 47 items
APL SKNCLS STERI-STRIP NONHPOA (GAUZE/BANDAGES/DRESSINGS) ×1
BENZOIN TINCTURE PRP APPL 2/3 (GAUZE/BANDAGES/DRESSINGS) ×3 IMPLANT
BLADE EXTENDED COATED 6.5IN (ELECTRODE) IMPLANT
BLADE HEX COATED 2.75 (ELECTRODE) ×2 IMPLANT
BLADE SURG 10 STRL SS (BLADE) IMPLANT
BLADE SURG 15 STRL LF DISP TIS (BLADE) IMPLANT
BLADE SURG 15 STRL SS (BLADE)
BRIEF STRETCH FOR OB PAD LRG (UNDERPADS AND DIAPERS) ×3 IMPLANT
CANISTER SUCT 3000ML PPV (MISCELLANEOUS) ×2 IMPLANT
COVER BACK TABLE 60X90IN (DRAPES) ×2 IMPLANT
COVER MAYO STAND STRL (DRAPES) ×2 IMPLANT
DECANTER SPIKE VIAL GLASS SM (MISCELLANEOUS) ×1 IMPLANT
DRAPE LAPAROTOMY 100X72 PEDS (DRAPES) ×2 IMPLANT
DRAPE UTILITY XL STRL (DRAPES) ×2 IMPLANT
ELECT REM PT RETURN 9FT ADLT (ELECTROSURGICAL) ×2
ELECTRODE REM PT RTRN 9FT ADLT (ELECTROSURGICAL) ×1 IMPLANT
GAUZE 4X4 16PLY RFD (DISPOSABLE) IMPLANT
GAUZE SPONGE 4X4 12PLY STRL (GAUZE/BANDAGES/DRESSINGS) ×2 IMPLANT
GLOVE BIO SURGEON STRL SZ 6.5 (GLOVE) ×2 IMPLANT
GLOVE INDICATOR 7.0 STRL GRN (GLOVE) ×2 IMPLANT
GOWN SPEC L3 XXLG W/TWL (GOWN DISPOSABLE) ×2 IMPLANT
HYDROGEN PEROXIDE 16OZ (MISCELLANEOUS) ×1 IMPLANT
KIT TURNOVER CYSTO (KITS) ×2 IMPLANT
LOOP VESSEL MAXI BLUE (MISCELLANEOUS) IMPLANT
NEEDLE HYPO 22GX1.5 SAFETY (NEEDLE) ×2 IMPLANT
NS IRRIG 500ML POUR BTL (IV SOLUTION) ×2 IMPLANT
PACK BASIN DAY SURGERY FS (CUSTOM PROCEDURE TRAY) ×2 IMPLANT
PAD ABD 8X10 STRL (GAUZE/BANDAGES/DRESSINGS) ×2 IMPLANT
PAD ARMBOARD 7.5X6 YLW CONV (MISCELLANEOUS) IMPLANT
PENCIL BUTTON HOLSTER BLD 10FT (ELECTRODE) ×2 IMPLANT
SPONGE HEMORRHOID 8X3CM (HEMOSTASIS) IMPLANT
SPONGE SURGIFOAM ABS GEL 12-7 (HEMOSTASIS) IMPLANT
SUCTION FRAZIER HANDLE 10FR (MISCELLANEOUS)
SUCTION TUBE FRAZIER 10FR DISP (MISCELLANEOUS) IMPLANT
SUT CHROMIC 2 0 SH (SUTURE) IMPLANT
SUT CHROMIC 3 0 SH 27 (SUTURE) ×1 IMPLANT
SUT ETHIBOND 0 (SUTURE) IMPLANT
SUT VIC AB 2-0 SH 27 (SUTURE)
SUT VIC AB 2-0 SH 27XBRD (SUTURE) IMPLANT
SUT VIC AB 3-0 SH 18 (SUTURE) IMPLANT
SUT VIC AB 4-0 P-3 18XBRD (SUTURE) IMPLANT
SUT VIC AB 4-0 P3 18 (SUTURE)
SYR CONTROL 10ML LL (SYRINGE) ×2 IMPLANT
TOWEL OR 17X24 6PK STRL BLUE (TOWEL DISPOSABLE) ×2 IMPLANT
TRAY DSU PREP LF (CUSTOM PROCEDURE TRAY) ×2 IMPLANT
TUBE CONNECTING 12X1/4 (SUCTIONS) ×2 IMPLANT
YANKAUER SUCT BULB TIP NO VENT (SUCTIONS) ×2 IMPLANT

## 2018-06-19 NOTE — Anesthesia Postprocedure Evaluation (Signed)
Anesthesia Post Note  Patient: Ricardo Walker  Procedure(s) Performed: EXCISION OF ANAL SKIN TAG (N/A Rectum)     Patient location during evaluation: PACU Anesthesia Type: MAC Level of consciousness: awake and alert Pain management: pain level controlled Vital Signs Assessment: post-procedure vital signs reviewed and stable Respiratory status: spontaneous breathing, nonlabored ventilation, respiratory function stable and patient connected to nasal cannula oxygen Cardiovascular status: stable and blood pressure returned to baseline Postop Assessment: no apparent nausea or vomiting Anesthetic complications: no    Last Vitals:  Vitals:   06/19/18 1245 06/19/18 1323  BP: 98/77 105/69  Pulse: (!) 50 64  Resp: 11 16  Temp:  36.6 C  SpO2: 100% 98%    Last Pain:  Vitals:   06/19/18 1323  TempSrc: Oral  PainSc: 0-No pain                 Ryan P Ellender

## 2018-06-19 NOTE — Interval H&P Note (Signed)
History and Physical Interval Note:  06/19/2018 9:48 AM  Ricardo Walker  has presented today for surgery, with the diagnosis of anal skin tag  The various methods of treatment have been discussed with the patient and family. After consideration of risks, benefits and other options for treatment, the patient has consented to  Procedure(s): EXCISION OF ANAL SKIN TAG (N/A) as a surgical intervention .  The patient's history has been reviewed, patient examined, no change in status, stable for surgery.  I have reviewed the patient's chart and labs.  Questions were answered to the patient's satisfaction.     Rosario Adie, MD  [facility]

## 2018-06-19 NOTE — Op Note (Signed)
06/19/2018  11:39 AM  PATIENT:  Ricardo Walker  67 y.o. male  Patient Care Team: [place], Delsa Grana, MD as PCP - General (Family Medicine)  PRE-OPERATIVE DIAGNOSIS:  anal skin tag  POST-OPERATIVE DIAGNOSIS:  anal skin tag  PROCEDURE:  EXCISION OF ANAL SKIN TAG  SURGEON:  Surgeon(s): Leighton Ruff, MD  ASSISTANT: none   ANESTHESIA:   local and MAC  SPECIMEN:  No Specimen  DISPOSITION OF SPECIMEN:  N/A  COUNTS:  YES  PLAN OF CARE: Discharge to home after PACU  PATIENT DISPOSITION:  PACU - hemodynamically stable.  INDICATION: 67 y.o. M with chronically inflamed anterior anal skin tag   OR FINDINGS: anterior skin tag  DESCRIPTION: the patient was identified in the preoperative holding area and taken to the OR where they were laid on the operating room table.  MAC anesthesia was induced without difficulty. The patient was then positioned in prone jackknife position with buttocks gently taped apart.  The patient was then prepped and draped in usual sterile fashion.  SCDs were noted to be in place prior to the initiation of anesthesia. A surgical timeout was performed indicating the correct patient, procedure, positioning and need for preoperative antibiotics.  A rectal block was performed using Marcaine with epinephrine.    The skin tag was elevated with forceps and trimmed off at skin level with scissors.  Interrupted 3-0 Chromic sutures were placed to close the skin defect.  Hemostasis was good.  The patient tolerated the procedure well and was awakened and sent to the PACU in stable condition.  All counts were correct.

## 2018-06-19 NOTE — Transfer of Care (Signed)
Immediate Anesthesia Transfer of Care Note  Patient: Ricardo Walker  Procedure(s) Performed: Procedure(s) (LRB): EXCISION OF ANAL SKIN TAG (N/A)  Patient Location: PACU  Anesthesia Type: MAC  Level of Consciousness: awake, alert , oriented and patient cooperative  Airway & Oxygen Therapy: Patient Spontanous Breathing and Patient connected to face mask oxygen  Post-op Assessment: Report given to PACU RN and Post -op Vital signs reviewed and stable  Post vital signs: Reviewed and stable  Complications: No apparent anesthesia complications  Last Vitals:  Vitals Value Taken Time  BP    Temp    Pulse    Resp    SpO2      Last Pain:  Vitals:   06/19/18 0938  TempSrc: Oral

## 2018-06-19 NOTE — Discharge Instructions (Addendum)
ANORECTAL SURGERY: POST OP INSTRUCTIONS 1. Take your usually prescribed home medications unless otherwise directed. 2. DIET: During the first few hours after surgery sip on some liquids until you are able to urinate.  It is normal to not urinate for several hours after this surgery.  If you feel uncomfortable, please contact the office for instructions.  After you are able to urinate,you may eat, if you feel like it.  Follow a light bland diet the first 24 hours after arrival home, such as soup, liquids, crackers, etc.  Be sure to include lots of fluids daily (6-8 glasses).  Avoid fast food or heavy meals, as your are more likely to get nauseated.  Eat a low fat diet the next few days after surgery.  Limit caffeine intake to 1-2 servings a day. 3. PAIN CONTROL: a. Pain is best controlled by a usual combination of several different methods TOGETHER: i. Muscle relaxation: Soak in a warm bath (or Sitz bath) three times a day and after bowel movements.  Continue to do this until all pain is resolved. ii. Over the counter pain medication  b. Most patients will experience some swelling and discomfort in the anus/rectal area and incisions.  Heat such as warm towels, sitz baths, warm baths, etc to help relax tight/sore spots and speed recovery.  Some people prefer to use ice, especially in the first couple days after surgery, as it may decrease the pain and swelling, or alternate between ice & heat.  Experiment to what works for you.  Swelling and bruising can take several weeks to resolve.  Pain can take even longer to completely resolve. c. It is helpful to take an over-the-counter pain medication regularly for the first few weeks.  Choose one of the following that works best for you: i. Naproxen (Aleve, etc)  Two 220mg  tabs twice a day ii. Ibuprofen (Advil, etc) Three 200mg  tabs four times a day (every meal & bedtime)  4. KEEP YOUR BOWELS REGULAR and AVOID CONSTIPATION a. The goal is one to two soft bowel  movements a day.  You should at least have a bowel movement every other day. b. Avoid getting constipated.  Between the surgery and the pain medications, it is common to experience some constipation. This can be very painful after rectal surgery.  Increasing fluid intake and taking a fiber supplement (such as Metamucil, Citrucel, FiberCon, etc) 1-2 times a day regularly will usually help prevent this problem from occurring.  A stool softener like colace is also recommended.  This can be purchased over the counter at your pharmacy.  You can take it up to 3 times a day.  If you do not have a bowel movement after 24 hrs since your surgery, take one does of milk of magnesia.  If you still haven't had a bowel movement 8-12 hours after that dose, take another dose.  If you don't have a bowel movement 48 hrs after surgery, purchase a Fleets enema from the drug store and administer gently per package instructions.  If you still are having trouble with your bowel movements after that, please call the office for further instructions. c. If you develop diarrhea or have many loose bowel movements, simplify your diet to bland foods & liquids for a few days.  Stop any stool softeners and decrease your fiber supplement.  Switching to mild anti-diarrheal medications (Kayopectate, Pepto Bismol) can help.  If this worsens or does not improve, please call us.  5. Wound Care a. Remove your  bandages before your first bowel movement or 8 hours after surgery.     b. Remove any wound packing material at this tim,e as well.  You do not need to repack the wound unless instructed otherwise.  Wear an absorbent pad or soft cotton gauze in your underwear to catch any drainage and help keep the area clean. You should change this every 2-3 hours while awake. c. Keep the area clean and dry.  Bathe / shower every day, especially after bowel movements.  Keep the area clean by showering / bathing over the incision / wound.   It is okay to soak  an open wound to help wash it.  Wet wipes or showers / gentle washing after bowel movements is often less traumatic than regular toilet paper. d. Dennis Bast may have some styrofoam-like soft packing in the rectum which will come out with the first bowel movement.  e. You will often notice bleeding with bowel movements.  This should slow down by the end of the first week of surgery f. Expect some drainage.  This should slow down, too, by the end of the first week of surgery.  Wear an absorbent pad or soft cotton gauze in your underwear until the drainage stops. g. Do Not sit on a rubber or pillow ring.  This can make you symptoms worse.  You may sit on a soft pillow if needed.  6. ACTIVITIES as tolerated:   a. You may resume regular (light) daily activities beginning the next day--such as daily self-care, walking, climbing stairs--gradually increasing activities as tolerated.  If you can walk 30 minutes without difficulty, it is safe to try more intense activity such as jogging, treadmill, bicycling, low-impact aerobics, swimming, etc. b. Save the most intensive and strenuous activity for last such as sit-ups, heavy lifting, contact sports, etc  Refrain from any heavy lifting or straining until you are off narcotics for pain control.   c. You may drive when you are no longer taking prescription pain medication, you can comfortably sit for long periods of time, and you can safely maneuver your car and apply brakes. d. Dennis Bast may have sexual intercourse when it is comfortable.  7. FOLLOW UP in our office a. Please call [facility] at [phone] to set up an appointment to see your surgeon in the office for a follow-up appointment approximately 3-4 weeks after your surgery. b. Make sure that you call for this appointment the day you arrive home to insure a convenient appointment time. 10. IF YOU HAVE DISABILITY OR FAMILY LEAVE FORMS, BRING THEM TO THE OFFICE FOR PROCESSING.  DO NOT GIVE THEM TO YOUR DOCTOR.     WHEN  TO CALL us [phone]: 1. Poor pain control 2. Reactions / problems with new medications (rash/itching, nausea, etc)  3. Fever over 101.5 F (38.5 C) 4. Inability to urinate 5. Nausea and/or vomiting 6. Worsening swelling or bruising 7. Continued bleeding from incision. 8. Increased pain, redness, or drainage from the incision  The clinic staff is available to answer your questions during regular business hours (8:30am-5pm).  Please dont hesitate to call and ask to speak to one of our nurses for clinical concerns.   A surgeon from  [facility] is always on call at the hospitals   If you have a medical emergency, go to the nearest emergency room or call 911.    [facility], [place], [place], [place], [place]  [address] ? MAIN: [phone] ?  TOLL FREE: [phone] ? FAX [phone] [email or web address]   Post Anesthesia Home Care Instructions  Activity: Get plenty of rest for the remainder of the day. A responsible individual must stay with you for 24 hours following the procedure.  For the next 24 hours, DO NOT: -Drive a car -Paediatric nurse -Drink alcoholic beverages -Take any medication unless instructed by your physician -Make any legal decisions or sign important papers.  Meals: Start with liquid foods such as gelatin or soup. Progress to regular foods as tolerated. Avoid greasy, spicy, heavy foods. If nausea and/or vomiting occur, drink only clear liquids until the nausea and/or vomiting subsides. Call your physician if vomiting continues.  Special Instructions/Symptoms: Your throat may feel dry or sore from the anesthesia or the breathing tube placed in your throat during surgery. If this causes discomfort, gargle with warm salt water. The discomfort should disappear within 24 hours.

## 2018-06-19 NOTE — Anesthesia Preprocedure Evaluation (Addendum)
Anesthesia Evaluation  Patient identified by MRN, date of birth, ID band Patient awake    Reviewed: Allergy & Precautions, NPO status , Patient's Chart, lab work & pertinent test results  Airway Mallampati: II  TM Distance: >3 FB Neck ROM: Full    Dental no notable dental hx.    Pulmonary sleep apnea and Continuous Positive Airway Pressure Ventilation , former smoker,    Pulmonary exam normal breath sounds clear to auscultation       Cardiovascular negative cardio ROS Normal cardiovascular exam Rhythm:Regular Rate:Normal     Neuro/Psych  Headaches, PSYCHIATRIC DISORDERS Anxiety Depression Meningioma     GI/Hepatic negative GI ROS, Neg liver ROS,   Endo/Other  negative endocrine ROS  Renal/GU negative Renal ROS     Musculoskeletal negative musculoskeletal ROS (+)   Abdominal   Peds  Hematology HLD   Anesthesia Other Findings Anal skin tag  Reproductive/Obstetrics                            Anesthesia Physical Anesthesia Plan  ASA: II  Anesthesia Plan: MAC   Post-op Pain Management:    Induction: Intravenous  PONV Risk Score and Plan: 1 and Propofol infusion and Treatment may vary due to age or medical condition  Airway Management Planned: Nasal Cannula  Additional Equipment:   Intra-op Plan:   Post-operative Plan:   Informed Consent: I have reviewed the patients History and Physical, chart, labs and discussed the procedure including the risks, benefits and alternatives for the proposed anesthesia with the patient or authorized representative who has indicated his/her understanding and acceptance.   Dental advisory given  Plan Discussed with: CRNA  Anesthesia Plan Comments:         Anesthesia Quick Evaluation

## 2018-06-20 ENCOUNTER — Encounter (HOSPITAL_BASED_OUTPATIENT_CLINIC_OR_DEPARTMENT_OTHER): Payer: Self-pay | Admitting: General Surgery

## 2018-12-03 ENCOUNTER — Encounter: Payer: Self-pay | Admitting: Physical Therapy

## 2018-12-03 ENCOUNTER — Ambulatory Visit: Payer: Medicare HMO | Attending: Family Medicine | Admitting: Physical Therapy

## 2018-12-03 ENCOUNTER — Other Ambulatory Visit: Payer: Self-pay

## 2018-12-03 DIAGNOSIS — M25531 Pain in right wrist: Secondary | ICD-10-CM

## 2018-12-03 DIAGNOSIS — M6281 Muscle weakness (generalized): Secondary | ICD-10-CM | POA: Diagnosis present

## 2018-12-03 NOTE — Therapy (Signed)
[place], [place], [address] Phone: [phone]   Fax:  [phone]  Physical Therapy Evaluation  Patient Details  Name: Ricardo Walker MRN: [id number] Date of Birth: [birth year] Referring Provider (PT): Corrington, Delsa Grana, MD   Encounter Date: 12/03/2018  PT End of Session - 12/03/18 0944    Visit Number  1    Number of Visits  13    Date for PT Re-Evaluation  01/14/19    PT Start Time  0846    PT Stop Time  0931    PT Time Calculation (min)  45 min    Activity Tolerance  Patient tolerated treatment well    Behavior During Therapy  [facility] for tasks assessed/performed       Past Medical History:  Diagnosis Date  . Anal skin tag   . Anxiety   . DDD (degenerative disc disease), lumbar    L5-S1  . Depression   . Fatty liver 10/09/2006   Mild diffuse, noted on CT chest  . Headache   . Hemorrhoids   . Hyperlipidemia   . Insomnia   . Meningioma ([place])   . OA (osteoarthritis)    toe  . Sleep apnea    cpap--waiting on mouth guard, auto CPAP, 5-15 cm water  . Wears glasses     Past Surgical History:  Procedure Laterality Date  . COLONOSCOPY  2007  . CRANIOTOMY  2003  . EXCISION OF SKIN TAG N/A 06/19/2018   Procedure: EXCISION OF ANAL SKIN TAG;  Surgeon: Leighton Ruff, MD;  Location: [facility];  Service: General;  Laterality: N/A;  . HAMMER TOE SURGERY Left 09/2015  . INGUINAL HERNIA REPAIR  [id number]   l,r  . KNEE ARTHROSCOPY Right 2003    There were no vitals filed for this visit.   Subjective Assessment - 12/03/18 0855    Subjective  pt reports pain began with gripping and lifting a water bottle with the R hand starting with no specific onset around 1 month ago. he reports pain stays in the wrist and is unable to pinpoint a specific area that causes pain. since onset it has been worsening.    Limitations  Lifting    How long can you sit comfortably?  unlimited    How  long can you stand comfortably?  unlimited    How long can you walk comfortably?  unlimited    Diagnostic tests  N/A    Patient Stated Goals  to decrease pain, to return to gym exercise    Currently in Pain?  Yes    Pain Score  4    at worst 7/10   Pain Location  Wrist    Pain Orientation  Right;Anterior;Posterior    Pain Descriptors / Indicators  Aching;Sharp;Sore    Pain Type  Chronic pain    Pain Onset  More than a month ago    Pain Frequency  Intermittent    Aggravating Factors   with specific movement,     Pain Relieving Factors  getting out of the position.    Effect of Pain on Daily Activities  limited gripping/ lifting         [facility] PT Assessment - 12/03/18 0847      Assessment   Medical Diagnosis  R wrist pain    Referring Provider (PT)  Corrington, Kip A, MD    Onset Date/Surgical Date  --   1 month ago   Hand Dominance  Right    Next MD Visit  12/18/2018    Prior Therapy  yes   for LBP     Precautions   Precautions  None      Restrictions   Weight Bearing Restrictions  No      Balance Screen   Has the patient fallen in the past 6 months  No    Has the patient had a decrease in activity level because of a fear of falling?   No    Is the patient reluctant to leave their home because of a fear of falling?   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   Vocation Requirements  prolonged sittting/ standing/ walking    Leisure  yoga, play guitar, movies, live music      Cognition   Overall Cognitive Status  Within Functional Limits for tasks assessed      Observation/Other Assessments   Focus on Therapeutic Outcomes (FOTO)   39% limited   predicted  28% limited     Posture/Postural Control   Posture/Postural Control  Postural limitations    Postural Limitations  Forward head      ROM / Strength   AROM / PROM / Strength  AROM;PROM;Strength      AROM   AROM Assessment Site  Wrist;Forearm    Right/Left Forearm  Right;Left    Right Forearm Pronation  102 Degrees    Right Forearm Supination  90 Degrees    Left Forearm Pronation  112 Degrees    Left Forearm Supination  90 Degrees    Right/Left Wrist  Left    Right Wrist Extension  62 Degrees   PDM   Right Wrist Flexion  62 Degrees   PDM   Right Wrist Radial Deviation  28 Degrees    Right Wrist Ulnar Deviation  30 Degrees   PDM   Left Wrist Extension  72 Degrees    Left Wrist Flexion  60 Degrees    Left Wrist Radial Deviation  10 Degrees    Left Wrist Ulnar Deviation  30 Degrees      Strength   Strength Assessment Site  Forearm;Wrist;Hand    Right/Left Forearm  Right;Left    Right Forearm Pronation  4/5    Right Forearm Supination  4/5    Left Forearm Pronation  4+/5    Left Forearm Supination  4+/5    Right/Left Wrist  Right;Left    Right Wrist Flexion  4/5    Right Wrist Extension  4/5    Left Wrist Flexion  4+/5    Left Wrist Extension  4+/5    Right Hand Grip (lbs)  51.3   36,51,67   Left Hand Grip (lbs)  71.3   78,65,71     Palpation   Palpation comment  TTP along the palmar aspect of the proximal carpals, tenderness in the anatomical snuff box, and multiple trigger points along proximal wrist flexors                Objective measurements completed on  examination: See above findings.      [facility] PT Treatment/Exercise - 12/03/18 0847      Wrist Exercises   Other wrist exercises  wrist flexor stretch 1 x 30 sec    Other wrist exercises  supination/ pronation 1 x 10             PT Education - 12/03/18 0846    Education Details  evaluation findings, POC, goals, HEP with proper form/ rationale    Person(s) Educated  Patient     Methods  Explanation;Verbal cues;Handout    Comprehension  Verbalized understanding;Verbal cues required       PT Short Term Goals - 12/03/18 1022      PT SHORT TERM GOAL #1   Title  pt to be I with inital HEP    Time  3    Period  Weeks    Status  New    Target Date  12/24/18        PT Long Term Goals - 12/03/18 1023      PT LONG TERM GOAL #1   Title  pt to increase R wrist ROM to [facility] compared bil with </= 1/10 pain during assessment    Time  6    Period  Weeks    Status  New    Target Date  01/14/19      PT LONG TERM GOAL #2   Title  increase strength to >/= 4+/5 in all R wrist/ forearm movements with </=1/10 for functional gripping, lifting and carrying activities     Time  6    Period  Weeks    Status  New    Target Date  01/14/19      PT LONG TERM GOAL #3   Title  pt to increase R grip strength by >/= 10# to demo improvement     Time  6    Period  Weeks    Status  New    Target Date  01/14/19      PT LONG TERM GOAL #4   Title  improve FOTO score to >/= 28% limited to demo improvement in function     Time  6    Period  Weeks    Status  New    Target Date  01/14/19      PT LONG TERM GOAL #5   Title  pt to return to gym exericse with </= 1/10 wrist pain for patient's personal goals    Time  6    Period  Weeks    Status  New    Target Date  01/14/19             Plan - 12/03/18 1016    Clinical Impression Statement  pt presents to OPPT with CC of R wrist pain staring insidiously 1 month ago. He demonstrates mild limitation of R wrist ROM compared bil with PDM, and mild weakness. he notes difficulty with manipulating items with the L hand, but denis any elbow or shoulder involvement. TTP noted along the radiocarpal and ulnarcarpal joints and scaphoid tenderness. He would benefit from physical therapy to decrease pain, improve ROM/ strength, and return pt to PLOF by addressing the deficits listed.     History and Personal Factors relevant to plan of care:   pt lives alone, hx of depression/ anxiety    Clinical Presentation  Evolving    Clinical Presentation due to:  worsening R wrist pain, R wrist weakness, limited ROM compared  bil. difficulty located specific area of pain    Clinical Decision Making  Moderate    Rehab Potential  Good    PT Frequency  2x / week   working to 1 x a week as able   PT Duration  6 weeks    PT Treatment/Interventions  ADLs/Self Care Home Management;Cryotherapy;Electrical Stimulation;Iontophoresis 4mg /ml Dexamethasone;Moist Heat;Ultrasound;Therapeutic activities;Dry needling;Taping;Manual techniques;Passive range of motion;Patient/family education;Therapeutic exercise;Neuromuscular re-education    PT Next Visit Plan  review/ update HEP, wrist mobs, intercarpal glides, grip strength, wrist/ elbow strengthening, trial ionto if signed off my MD    PT Home Exercise Plan  wrist flexor stretch, wrist flexion/ extension, weight radial deviation, towel grip, supination prontaiton     Consulted and Agree with Plan of Care  Patient       Patient will benefit from skilled therapeutic intervention in order to improve the following deficits and impairments:  Pain, Impaired UE functional use, Decreased strength, Postural dysfunction, Improper body mechanics, Decreased range of motion, Decreased endurance, Decreased activity tolerance  Visit Diagnosis: Pain in right wrist  Muscle weakness (generalized)     Problem List Patient Active Problem List   Diagnosis Date Noted  . Insomnia 01/30/2017  . OSA (obstructive sleep apnea) 10/06/2014  . PLANTAR FASCIITIS, RIGHT 11/05/2008   Starr Lake PT, DPT, LAT, ATC  12/03/18  10:28 AM      [facility] [address] [place] [place], [place], [address] Phone: [phone]   Fax:  [phone]  Name: Ricardo Walker MRN: [id number] Date of Birth: 06/07/1951

## 2018-12-10 ENCOUNTER — Encounter: Payer: Self-pay | Admitting: Physical Therapy

## 2018-12-10 ENCOUNTER — Ambulatory Visit: Payer: Medicare HMO | Admitting: Physical Therapy

## 2018-12-10 DIAGNOSIS — M25531 Pain in right wrist: Secondary | ICD-10-CM

## 2018-12-10 DIAGNOSIS — M6281 Muscle weakness (generalized): Secondary | ICD-10-CM

## 2018-12-10 NOTE — Therapy (Addendum)
[place], [place], [address] Phone: [phone]   Fax:  [phone]  Physical Therapy Treatment  Patient Details  Name: HALDON CARLEY MRN: [id number] Date of Birth: 1951/08/08 Referring Provider (PT): Corrington, Delsa Grana, MD   Encounter Date: 12/10/2018  PT End of Session - 12/10/18 1458    Visit Number  2    Number of Visits  13    Date for PT Re-Evaluation  01/14/19    PT Start Time  0931    PT Stop Time  1016    PT Time Calculation (min)  45 min    Activity Tolerance  Patient tolerated treatment well    Behavior During Therapy  [facility] for tasks assessed/performed       Past Medical History:  Diagnosis Date  . Anal skin tag   . Anxiety   . DDD (degenerative disc disease), lumbar    L5-S1  . Depression   . Fatty liver 10/09/2006   Mild diffuse, noted on CT chest  . Headache   . Hemorrhoids   . Hyperlipidemia   . Insomnia   . Meningioma ([place])   . OA (osteoarthritis)    toe  . Sleep apnea    cpap--waiting on mouth guard, auto CPAP, 5-15 cm water  . Wears glasses     Past Surgical History:  Procedure Laterality Date  . COLONOSCOPY  2007  . CRANIOTOMY  2003  . EXCISION OF SKIN TAG N/A 06/19/2018   Procedure: EXCISION OF ANAL SKIN TAG;  Surgeon: Leighton Ruff, MD;  Location: [facility];  Service: General;  Laterality: N/A;  . HAMMER TOE SURGERY Left 09/2015  . INGUINAL HERNIA REPAIR  [phone]   l,r  . KNEE ARTHROSCOPY Right 2003    There were no vitals filed for this visit.  Subjective Assessment - 12/10/18 0937    Subjective  Pt reports he has been participating in a sleep study and has not had much time to perform his exercises at home.     Currently in Pain?  Yes    Pain Score  4     Pain Location  Wrist    Pain Orientation  Right;Anterior;Posterior    Pain Descriptors / Indicators  Sharp    Pain Type  Chronic pain    Aggravating Factors   with specific  movements    Pain Relieving Factors  stopping the painful motion                       [facility] Adult PT Treatment/Exercise - 12/10/18 0001      Exercises   Exercises  Wrist      Wrist Exercises   Wrist Flexion  Strengthening;Right;20 reps;Bar weights/barbell    Bar Weights/Barbell (Wrist Flexion)  1 lb    Wrist Extension  Strengthening;Right;20 reps;Bar weights/barbell    Bar Weights/Barbell (Wrist Extension)  1 lb    Wrist Radial Deviation  Strengthening;Right;Seated   With towel and #1 x12; 2x10 with #1 forarm neutral    Bar Weights/Barbell (Radial Deviation)  1 lb    Other wrist exercises  wrist flexor stretch 3 x 20 secs    Other wrist exercises  supination/ pronation and flexion/extension on velcro board 4 minutes   Used key and largest attachments     Modalities   Modalities  Iontophoresis No charge due to patient unaware of non-coverage.     Iontophoresis   Type of Iontophoresis  Dexamethasone    Location  Volar aspect of wrist going up forearm    Dose  1 cc    Time  4-6 hours      Manual Therapy   Manual Therapy  Soft tissue mobilization    Soft tissue mobilization  Metacarpal glides A/P; Carpal Glides A/P into pain free ranges             PT Education - 12/10/18 1502    Education Details  Reviewed HEP, educated about iontophoresis     Person(s) Educated  Patient    Methods  Explanation    Comprehension  Verbalized understanding       PT Short Term Goals - 12/03/18 1022      PT SHORT TERM GOAL #1   Title  pt to be I with inital HEP    Time  3    Period  Weeks    Status  New    Target Date  12/24/18        PT Long Term Goals - 12/03/18 1023      PT LONG TERM GOAL #1   Title  pt to increase R wrist ROM to [facility] compared bil with </= 1/10 pain during assessment    Time  6    Period  Weeks    Status  New    Target Date  01/14/19      PT LONG TERM GOAL #2   Title  increase strength to >/= 4+/5 in all R wrist/ forearm movements  with </=1/10 for functional gripping, lifting and carrying activities     Time  6    Period  Weeks    Status  New    Target Date  01/14/19      PT LONG TERM GOAL #3   Title  pt to increase R grip strength by >/= 10# to demo improvement     Time  6    Period  Weeks    Status  New    Target Date  01/14/19      PT LONG TERM GOAL #4   Title  improve FOTO score to >/= 28% limited to demo improvement in function     Time  6    Period  Weeks    Status  New    Target Date  01/14/19      PT LONG TERM GOAL #5   Title  pt to return to gym exericse with </= 1/10 wrist pain for patient's personal goals    Time  6    Period  Weeks    Status  New    Target Date  01/14/19      Clinical Impression Statement:  Pt reports a 4/10 pain and stated that he has not completed his HEP d/t being in a sleep study, but he watched the videos of the exercises. Pt c/o some pain on the volar aspect of wrist when performing pronation and wrist extension. PTA applied ionto to volar aspect of wrist to manage pain. Educated pt on importance of performing HEP at home before we add to his program. Performed joint mobilizations to metacarpals and carpal joints to enhance ROM. Pt tolerated tx well.   Next Visit Plan: assess response to ionto review/ update HEP, wrist mobs, intercarpal glides, grip strength, wrist/ elbow strengthening, continue ROM and wrist strengthening         During this treatment session, the therapist was present, participating in and directing the treatment.  Patient will  benefit from skilled therapeutic intervention in order to improve the following deficits and impairments:     Visit Diagnosis: Pain in right wrist  Muscle weakness (generalized)     Problem List Patient Active Problem List   Diagnosis Date Noted  . Insomnia 01/30/2017  . OSA (obstructive sleep apnea) 10/06/2014  . PLANTAR FASCIITIS, RIGHT 11/05/2008    Fuller Mandril , SPTA 12/10/2018, 3:03 PM   Hessie Diener, PTA 12/11/18 8:08 AM Phone: [phone] Fax: [place], [place], [address] Phone: [phone]   Fax:  [phone]  Name: CHRISS MANNAN MRN: [id number] Date of Birth: Mar 30, 1951

## 2018-12-12 ENCOUNTER — Encounter: Payer: Self-pay | Admitting: Physical Therapy

## 2018-12-12 ENCOUNTER — Ambulatory Visit: Payer: Medicare HMO | Admitting: Physical Therapy

## 2018-12-12 DIAGNOSIS — M6281 Muscle weakness (generalized): Secondary | ICD-10-CM

## 2018-12-12 DIAGNOSIS — M25531 Pain in right wrist: Secondary | ICD-10-CM | POA: Diagnosis not present

## 2018-12-12 NOTE — Therapy (Signed)
[place], [place], [address] Phone: [phone]   Fax:  [phone]  Physical Therapy Treatment  Patient Details  Name: Ricardo Walker MRN: [id number] Date of Birth: 1950/12/08 Referring Provider (PT): Corrington, Delsa Grana, MD   Encounter Date: 12/12/2018  PT End of Session - 12/12/18 1420    Visit Number  3    Number of Visits  13    Date for PT Re-Evaluation  01/14/19    PT Start Time  1419    PT Stop Time  1502    PT Time Calculation (min)  43 min    Activity Tolerance  Patient tolerated treatment well    Behavior During Therapy  [facility] for tasks assessed/performed       Past Medical History:  Diagnosis Date  . Anal skin tag   . Anxiety   . DDD (degenerative disc disease), lumbar    L5-S1  . Depression   . Fatty liver 10/09/2006   Mild diffuse, noted on CT chest  . Headache   . Hemorrhoids   . Hyperlipidemia   . Insomnia   . Meningioma ([place])   . OA (osteoarthritis)    toe  . Sleep apnea    cpap--waiting on mouth guard, auto CPAP, 5-15 cm water  . Wears glasses     Past Surgical History:  Procedure Laterality Date  . COLONOSCOPY  2007  . CRANIOTOMY  2003  . EXCISION OF SKIN TAG N/A 06/19/2018   Procedure: EXCISION OF ANAL SKIN TAG;  Surgeon: Leighton Ruff, MD;  Location: [facility];  Service: General;  Laterality: N/A;  . HAMMER TOE SURGERY Left 09/2015  . INGUINAL HERNIA REPAIR  [phone]   l,r  . KNEE ARTHROSCOPY Right 2003    There were no vitals filed for this visit.  Subjective Assessment - 12/12/18 1423    Subjective  " I am still dealing with chronic fatigue and am still waiting. The wrist is still kind of about the same"     Currently in Pain?  Yes    Pain Score  0-No pain   max 4/10   Pain Location  Wrist    Pain Orientation  Right         [facility] PT Assessment - 12/12/18 0001      Assessment   Medical Diagnosis  R wrist pain    Referring  Provider (PT)  Corrington, Delsa Grana, MD                   [facility] Adult PT Treatment/Exercise - 12/12/18 0001      Wrist Exercises   Wrist Flexion  Strengthening;Right;Bar weights/barbell;10 reps    Bar Weights/Barbell (Wrist Flexion)  3 lbs    Wrist Extension  Strengthening;Right;Bar weights/barbell;10 reps   with emphasis    Bar Weights/Barbell (Wrist Extension)  3 lbs    Wrist Radial Deviation  Strengthening;Right;Seated    Bar Weights/Barbell (Radial Deviation)  3 lbs    Other wrist exercises  wrist tendon glides holding ea position 5 seconds      Iontophoresis   Type of Iontophoresis  Dexamethasone    Location  Volar aspect of wrist going up forearm    Dose  1 cc    Time  4-6 hours      Manual Therapy   Manual Therapy  Joint mobilization    Joint Mobilization  dorsal ulnocarpaglides grade II-III, intercarpal glides 2 x 10  PT Education - 12/12/18 1447    Education Details  reviewed HEP and benefits of doing the exercises at home. updated HEP    Person(s) Educated  Patient    Methods  Explanation;Verbal cues    Comprehension  Verbalized understanding;Verbal cues required       PT Short Term Goals - 12/03/18 1022      PT SHORT TERM GOAL #1   Title  pt to be I with inital HEP    Time  3    Period  Weeks    Status  New    Target Date  12/24/18        PT Long Term Goals - 12/03/18 1023      PT LONG TERM GOAL #1   Title  pt to increase R wrist ROM to [facility] compared bil with </= 1/10 pain during assessment    Time  6    Period  Weeks    Status  New    Target Date  01/14/19      PT LONG TERM GOAL #2   Title  increase strength to >/= 4+/5 in all R wrist/ forearm movements with </=1/10 for functional gripping, lifting and carrying activities     Time  6    Period  Weeks    Status  New    Target Date  01/14/19      PT LONG TERM GOAL #3   Title  pt to increase R grip strength by >/= 10# to demo improvement     Time  6    Period  Weeks     Status  New    Target Date  01/14/19      PT LONG TERM GOAL #4   Title  improve FOTO score to >/= 28% limited to demo improvement in function     Time  6    Period  Weeks    Status  New    Target Date  01/14/19      PT LONG TERM GOAL #5   Title  pt to return to gym exericse with </= 1/10 wrist pain for patient's personal goals    Time  6    Period  Weeks    Status  New    Target Date  01/14/19            Plan - 12/12/18 1455    Clinical Impression Statement  pt reports continued soreness at max 4/10 pain .continued working on wrist mobs to decrease pain and strengthening of the wrist. He continues to report he hasn't done his HEP, and is vague in describing the pain and location.  continued discussing benefits of doing his HEP.     PT Treatment/Interventions  ADLs/Self Care Home Management;Cryotherapy;Electrical Stimulation;Iontophoresis 4mg /ml Dexamethasone;Moist Heat;Ultrasound;Therapeutic activities;Dry needling;Taping;Manual techniques;Passive range of motion;Patient/family education;Therapeutic exercise;Neuromuscular re-education    PT Next Visit Plan  assess response to ionto and continue prn; review/ update HEP, wrist mobs, intercarpal glides, grip strength, wrist/ elbow strengthening, continue ROM and wrist strengthening     PT Home Exercise Plan  wrist flexor stretch, wrist flexion/ extension, weight radial deviation, towel grip, supination prontaiton     Consulted and Agree with Plan of Care  Patient       Patient will benefit from skilled therapeutic intervention in order to improve the following deficits and impairments:  Pain, Impaired UE functional use, Decreased strength, Postural dysfunction, Improper body mechanics, Decreased range of motion, Decreased endurance, Decreased activity tolerance  Visit Diagnosis:  Pain in right wrist  Muscle weakness (generalized)     Problem List Patient Active Problem List   Diagnosis Date Noted  . Insomnia 01/30/2017   . OSA (obstructive sleep apnea) 10/06/2014  . PLANTAR FASCIITIS, RIGHT 11/05/2008   Starr [place] PT, DPT, LAT, ATC  12/12/18  3:04 PM      [place] [facility] [address] [place], [place], [address] Phone: [phone]   Fax:  [phone]  Name: Ricardo Walker MRN: [id number] Date of Birth: 1951-10-12

## 2018-12-16 ENCOUNTER — Ambulatory Visit: Payer: Medicare HMO | Admitting: Physical Therapy

## 2018-12-16 DIAGNOSIS — M25531 Pain in right wrist: Secondary | ICD-10-CM

## 2018-12-16 DIAGNOSIS — M6281 Muscle weakness (generalized): Secondary | ICD-10-CM

## 2018-12-16 NOTE — Patient Instructions (Signed)
Issued from ex drawer:   From Arthritis Education guide Program handout. Pages 53 through 37.  Daily  5 to 10 x each   Hold 1 second

## 2018-12-16 NOTE — Therapy (Signed)
[place], [place], [address] Phone: [phone]   Fax:  [phone]  Physical Therapy Treatment  Patient Details  Name: Ricardo Walker MRN: [id number] Date of Birth: 10/13/1951 Referring Provider (PT): Corrington, Delsa Grana, MD   Encounter Date: 12/16/2018  PT End of Session - 12/16/18 1840    Visit Number  4    Number of Visits  13    Date for PT Re-Evaluation  01/14/19    PT Start Time  9211    PT Stop Time  1413    PT Time Calculation (min)  38 min    Activity Tolerance  Patient tolerated treatment well    Behavior During Therapy  [facility] for tasks assessed/performed       Past Medical History:  Diagnosis Date  . Anal skin tag   . Anxiety   . DDD (degenerative disc disease), lumbar    L5-S1  . Depression   . Fatty liver 10/09/2006   Mild diffuse, noted on CT chest  . Headache   . Hemorrhoids   . Hyperlipidemia   . Insomnia   . Meningioma ([place])   . OA (osteoarthritis)    toe  . Sleep apnea    cpap--waiting on mouth guard, auto CPAP, 5-15 cm water  . Wears glasses     Past Surgical History:  Procedure Laterality Date  . COLONOSCOPY  2007  . CRANIOTOMY  2003  . EXCISION OF SKIN TAG N/A 06/19/2018   Procedure: EXCISION OF ANAL SKIN TAG;  Surgeon: Leighton Ruff, MD;  Location: [facility];  Service: General;  Laterality: N/A;  . HAMMER TOE SURGERY Left 09/2015  . INGUINAL HERNIA REPAIR  [id number]   l,r  . KNEE ARTHROSCOPY Right 2003    There were no vitals filed for this visit.  Subjective Assessment - 12/16/18 1335    Subjective  Wrist is the same.  I am doing the exercises.  The Exercises do not hurt.   No changes with the patch yet.    Pain is brief.  Stopped the gym prior the onset of wrist pain.      Currently in Pain?  No/denies    Pain Score  0-No pain    up to 3-4/10   Pain Orientation  Right    Pain Descriptors / Indicators  Sharp   brief,    Pain Type   Chronic pain    Pain Frequency  Intermittent    Aggravating Factors   typoing,  using the mouse.  hold  a picture of water.      Pain Relieving Factors  stopping the motion.      Effect of Pain on Daily Activities  limits gripping.                         [facility] Adult PT Treatment/Exercise - 12/16/18 0001      Hand Exercises   Digit Abduction/Adduction  10 X    Hand Gripper with Small Beads  springs  10 X  cued technique    Other Hand Exercises  O shape with each finger to thumb,  each finger lift  arm flat on table with wrist and fingers neutral 5 to 10 x each,  cues to keep fingers fron ulnar deviating.,  thumb circles,,  alking fingers toward thumb ,  fist then spread fingers      Wrist Exercises   Wrist Flexion  10  reps    Bar Weights/Barbell (Wrist Flexion)  3 lbs    Wrist Extension  Strengthening    Bar Weights/Barbell (Wrist Extension)  3 lbs    Wrist Extension Limitations  cued neutral wrist    Wrist Radial Deviation  Strengthening    Other wrist exercises  supination pronation holding weight like a hammer  5 X  ROM increased.      Iontophoresis   Type of Iontophoresis  Dexamethasone    Location  Volar aspect of wrist going up forearm    Dose  1cc    Time  4-6 hours             PT Education - 12/16/18 1840    Education Details  HEP    Person(s) Educated  Patient    Methods  Explanation;Demonstration;Tactile cues;Verbal cues;Handout    Comprehension  Verbalized understanding;Returned demonstration       PT Short Term Goals - 12/03/18 1022      PT SHORT TERM GOAL #1   Title  pt to be I with inital HEP    Time  3    Period  Weeks    Status  New    Target Date  12/24/18        PT Long Term Goals - 12/03/18 1023      PT LONG TERM GOAL #1   Title  pt to increase R wrist ROM to [facility] compared bil with </= 1/10 pain during assessment    Time  6    Period  Weeks    Status  New    Target Date  01/14/19      PT LONG TERM GOAL #2   Title   increase strength to >/= 4+/5 in all R wrist/ forearm movements with </=1/10 for functional gripping, lifting and carrying activities     Time  6    Period  Weeks    Status  New    Target Date  01/14/19      PT LONG TERM GOAL #3   Title  pt to increase R grip strength by >/= 10# to demo improvement     Time  6    Period  Weeks    Status  New    Target Date  01/14/19      PT LONG TERM GOAL #4   Title  improve FOTO score to >/= 28% limited to demo improvement in function     Time  6    Period  Weeks    Status  New    Target Date  01/14/19      PT LONG TERM GOAL #5   Title  pt to return to gym exericse with </= 1/10 wrist pain for patient's personal goals    Time  6    Period  Weeks    Status  New    Target Date  01/14/19            Plan - 12/16/18 1841    Clinical Impression Statement  Pain is unchanged.    Education today on hand posture. His fingers/ wrist drift medially with  exercises unless cues given.         PT Next Visit Plan  Check ionto response. ; review/ update HEP, wrist mobs, intercarpal glides, grip strength, wrist/ elbow strengthening, continue ROM and wrist strengthening     PT Home Exercise Plan  wrist flexor stretch, wrist flexion/ extension, weight radial deviation, towel grip, supination prontaiton  hand exercises  fingers O, lifting,  thumb abduction    Lift and move toward thumb.     Consulted and Agree with Plan of Care  Patient       Patient will benefit from skilled therapeutic intervention in order to improve the following deficits and impairments:     Visit Diagnosis: Pain in right wrist  Muscle weakness (generalized)     Problem List Patient Active Problem List   Diagnosis Date Noted  . Insomnia 01/30/2017  . OSA (obstructive sleep apnea) 10/06/2014  . PLANTAR FASCIITIS, RIGHT 11/05/2008    HARRIS,KAREN  PTA 12/16/2018, 6:49 PM  [facility] [address] [place], [place],  [address] Phone: [phone]   Fax:  [phone]  Name: Ricardo Walker MRN: [id number] Date of Birth: 1950/12/21

## 2018-12-19 ENCOUNTER — Ambulatory Visit: Payer: Medicare HMO | Admitting: Physical Therapy

## 2018-12-23 ENCOUNTER — Encounter: Payer: Self-pay | Admitting: Physical Therapy

## 2018-12-24 ENCOUNTER — Encounter: Payer: Self-pay | Admitting: Physical Therapy

## 2018-12-24 ENCOUNTER — Ambulatory Visit: Payer: Medicare HMO | Admitting: Physical Therapy

## 2018-12-24 DIAGNOSIS — M6281 Muscle weakness (generalized): Secondary | ICD-10-CM

## 2018-12-24 DIAGNOSIS — M25531 Pain in right wrist: Secondary | ICD-10-CM

## 2018-12-24 NOTE — Therapy (Signed)
[place], [place], [address] Phone: [phone]   Fax:  [phone]  Physical Therapy Treatment  Patient Details  Name: Ricardo Walker MRN: [id number] Date of Birth: 13-Jul-1951 Referring Provider (PT): Corrington, Delsa Grana, MD   Encounter Date: 12/24/2018  PT End of Session - 12/24/18 1719    Visit Number  5    Number of Visits  13    Date for PT Re-Evaluation  01/14/19    PT Start Time  1018    PT Stop Time  1100    PT Time Calculation (min)  42 min    Activity Tolerance  Patient tolerated treatment well    Behavior During Therapy  [facility] for tasks assessed/performed       Past Medical History:  Diagnosis Date  . Anal skin tag   . Anxiety   . DDD (degenerative disc disease), lumbar    L5-S1  . Depression   . Fatty liver 10/09/2006   Mild diffuse, noted on CT chest  . Headache   . Hemorrhoids   . Hyperlipidemia   . Insomnia   . Meningioma ([place])   . OA (osteoarthritis)    toe  . Sleep apnea    cpap--waiting on mouth guard, auto CPAP, 5-15 cm water  . Wears glasses     Past Surgical History:  Procedure Laterality Date  . COLONOSCOPY  2007  . CRANIOTOMY  2003  . EXCISION OF SKIN TAG N/A 06/19/2018   Procedure: EXCISION OF ANAL SKIN TAG;  Surgeon: Leighton Ruff, MD;  Location: [facility];  Service: General;  Laterality: N/A;  . HAMMER TOE SURGERY Left 09/2015  . INGUINAL HERNIA REPAIR  [id number]   l,r  . KNEE ARTHROSCOPY Right 2003    There were no vitals filed for this visit.  Subjective Assessment - 12/24/18 1021    Subjective  wrist pain is unpredictable    Currently in Pain?  No/denies    Pain Score  0-No pain   up to 5-6/10   Pain Location  Wrist   pinch   Pain Orientation  Right    Pain Descriptors / Indicators  --   pinch   Pain Type  Chronic pain    Aggravating Factors   chopping   end range ROM    Pain Relieving Factors  stopping the motion    Effect  of Pain on Daily Activities  limits some gripping         [facility] PT Assessment - 12/24/18 0001      Strength   Right Hand Grip (lbs)  55.66   50, 62, 55                  [facility] Adult PT Treatment/Exercise - 12/24/18 0001      Hand Exercises   Digit Abduction/Adduction  10 X    Other Hand Exercises  O shape with each finger to thumb,  each finger lift  arm flat on table with wrist and fingers neutral 5 to 10 x each,  cues to keep fingers fron ulnar deviating.,  thumb circles,,  alking fingers toward thumb ,  fist then spread fingers   thumb ROM difficult     Wrist Exercises   Wrist Flexion  10 reps   other wrist ROM   5 to 10 x each   Other wrist exercises  srtetching extension reproduced symptoms,    Other wrist exercises  also stretchingflexion  gentle.       Manual Therapy   Manual therapy comments  distraction medially             PT Education - 12/24/18 1719    Education Details  exercise form    Person(s) Educated  Patient    Methods  Explanation;Demonstration;Tactile cues;Verbal cues    Comprehension  Returned demonstration       PT Short Term Goals - 12/24/18 1723      PT SHORT TERM GOAL #1   Title  pt to be I with inital HEP    Baseline  minor cues needed    Time  3    Period  Weeks    Status  Partially Met        PT Long Term Goals - 12/03/18 1023      PT LONG TERM GOAL #1   Title  pt to increase R wrist ROM to [facility] compared bil with </= 1/10 pain during assessment    Time  6    Period  Weeks    Status  New    Target Date  01/14/19      PT LONG TERM GOAL #2   Title  increase strength to >/= 4+/5 in all R wrist/ forearm movements with </=1/10 for functional gripping, lifting and carrying activities     Time  6    Period  Weeks    Status  New    Target Date  01/14/19      PT LONG TERM GOAL #3   Title  pt to increase R grip strength by >/= 10# to demo improvement     Time  6    Period  Weeks    Status  New    Target Date   01/14/19      PT LONG TERM GOAL #4   Title  improve FOTO score to >/= 28% limited to demo improvement in function     Time  6    Period  Weeks    Status  New    Target Date  01/14/19      PT LONG TERM GOAL #5   Title  pt to return to gym exericse with </= 1/10 wrist pain for patient's personal goals    Time  6    Period  Weeks    Status  New    Target Date  01/14/19            Plan - 12/24/18 1720    Clinical Impression Statement  Hand exercises fore HEP required minimal cues for perfected technique.  STG#1 partially met.   Wrist stretching reproduced symptoms.  Grip strenght  at intake  51.3 LBS average.  Today grip improved to 55.66 LBS average with pain.   His pain is unpredictable.     PT Next Visit Plan  continue wrist /hand  exercises .  manual as needed.     PT Home Exercise Plan  wrist flexor stretch, wrist flexion/ extension, weight radial deviation, towel grip, supination prontaiton   hand exercises  fingers O, lifting,  thumb abduction    Lift and move toward thumb.     Consulted and Agree with Plan of Care  Patient       Patient will benefit from skilled therapeutic intervention in order to improve the following deficits and impairments:     Visit Diagnosis: Pain in right wrist  Muscle weakness (generalized)     Problem List Patient Active Problem List  Diagnosis Date Noted  . Insomnia 01/30/2017  . OSA (obstructive sleep apnea) 10/06/2014  . PLANTAR FASCIITIS, RIGHT 11/05/2008    HARRIS,KAREN PTA 12/24/2018, 5:24 PM  [facility] [address] [place], [place], [address] Phone: [phone]   Fax:  [phone]  Name: Ricardo Walker MRN: [id number] Date of Birth: 08/26/1951

## 2018-12-26 ENCOUNTER — Encounter: Payer: Self-pay | Admitting: Physical Therapy

## 2018-12-26 ENCOUNTER — Ambulatory Visit: Payer: Medicare HMO | Admitting: Physical Therapy

## 2018-12-26 DIAGNOSIS — M25531 Pain in right wrist: Secondary | ICD-10-CM

## 2018-12-26 DIAGNOSIS — M6281 Muscle weakness (generalized): Secondary | ICD-10-CM

## 2018-12-26 NOTE — Therapy (Signed)
[facility] [address] [place], [place], [address] Phone: [phone]   Fax:  [phone]  Physical Therapy Treatment  Patient Details  Name: Ricardo [place] MRN: [id number] Date of Birth: 05/15/1951 Referring Provider (PT): Corrington, Kip A, MD   Encounter Date: 12/26/2018  PT End of Session - 12/26/18 1105    Visit Number  6    Number of Visits  13    Date for PT Re-Evaluation  01/14/19    PT Start Time  1104    PT Stop Time  1144    PT Time Calculation (min)  40 min    Activity Tolerance  Patient tolerated treatment well    Behavior During Therapy  WFL for tasks assessed/performed       Past Medical History:  Diagnosis Date  . Anal skin tag   . Anxiety   . DDD (degenerative disc disease), lumbar    L5-S1  . Depression   . Fatty liver 10/09/2006   Mild diffuse, noted on CT chest  . Headache   . Hemorrhoids   . Hyperlipidemia   . Insomnia   . Meningioma (HCC)   . OA (osteoarthritis)    toe  . Sleep apnea    cpap--waiting on mouth guard, auto CPAP, 5-15 cm water  . Wears glasses     Past Surgical History:  Procedure Laterality Date  . COLONOSCOPY  2007  . CRANIOTOMY  2003  . EXCISION OF SKIN TAG N/A 06/19/2018   Procedure: EXCISION OF ANAL SKIN TAG;  Surgeon: Thomas, Alicia, MD;  Location: [facility];  Service: General;  Laterality: N/A;  . HAMMER TOE SURGERY Left 09/2015  . INGUINAL HERNIA REPAIR  1978,1994   l,r  . KNEE ARTHROSCOPY Right 2003    There were no vitals filed for this visit.  Subjective Assessment - 12/26/18 1105    Subjective  "I am doing my exercises more than I was, none of them hurt. The wrist still hurts and depends on various movements"   (Pended)     Patient Stated Goals  to decrease pain, to return to gym exercise  (Pended)     Currently in Pain?  No/denies  (Pended)     Pain Score  0-No pain  (Pended)     Pain Location  Wrist  (Pended)     Pain  Orientation  Right  (Pended)     Pain Type  Chronic pain  (Pended)     Pain Onset  More than a month ago  (Pended)     Pain Frequency  Intermittent  (Pended)          [facility] PT Assessment - 12/26/18 0001      Assessment   Medical Diagnosis  R wrist pain    Referring Provider (PT)  Corrington, Kip A, MD                   [facility] Adult PT Treatment/Exercise - 12/26/18 0001      Hand Exercises   Thumb Opposition  gripping using finger dynamometer gradual contraction and relax working on gripping to max level 2 x 10 (maxed at 9#)      Wrist Exercises   Wrist Flexion  Strengthening;Right;10 reps;Seated   using yellow therabar   Wrist Flexion Limitations  focusing on eccentric loading    Other wrist exercises  wrist flexor stretching      Manual Therapy   Manual therapy comments  MTPR along   the common wrist flexors and pronator quadratus, and thenar eminence musculature    Joint Mobilization  dorsal ulnocarpa glides grade [place], intercarpal glides                PT Short Term Goals - 12/24/18 1723      PT SHORT TERM GOAL #1   Title  pt to be I with inital HEP    Baseline  minor cues needed    Time  3    Period  Weeks    Status  Partially Met        PT Long Term Goals - 12/03/18 1023      PT LONG TERM GOAL #1   Title  pt to increase R wrist ROM to [facility] compared bil with </= 1/10 pain during assessment    Time  6    Period  Weeks    Status  New    Target Date  01/14/19      PT LONG TERM GOAL #2   Title  increase strength to >/= 4+/5 in all R wrist/ forearm movements with </=1/10 for functional gripping, lifting and carrying activities     Time  6    Period  Weeks    Status  New    Target Date  01/14/19      PT LONG TERM GOAL #3   Title  pt to increase R grip strength by >/= 10# to demo improvement     Time  6    Period  Weeks    Status  New    Target Date  01/14/19      PT LONG TERM GOAL #4   Title  improve FOTO score to >/= 28% limited to  demo improvement in function     Time  6    Period  Weeks    Status  New    Target Date  01/14/19      PT LONG TERM GOAL #5   Title  pt to return to gym exericse with </= 1/10 wrist pain for patient's personal goals    Time  6    Period  Weeks    Status  New    Target Date  01/14/19            Plan - 12/26/18 1242    Clinical Impression Statement  reports consistency with more HEP noting minimal improvements. focused on STW along the wrist flexors and thenar eminence musculature followed with flexor stretching. pt noted improvement with soreness with repeated wrist extension. continued working on strengthening. dicussed with pt if no progrss is made over the course of the next 2 visit then plan to refer back to the MD.     PT Treatment/Interventions  ADLs/Self Care Home Management;Cryotherapy;Electrical Stimulation;Iontophoresis 36m/ml Dexamethasone;Moist Heat;Ultrasound;Therapeutic activities;Dry needling;Taping;Manual techniques;Passive range of motion;Patient/family education;Therapeutic exercise;Neuromuscular re-education    PT Next Visit Plan  continue wrist /hand  exercises .  manual as needed, along thenar and wrist flexors. finger strengthening,     Consulted and Agree with Plan of Care  Patient       Patient will benefit from skilled therapeutic intervention in order to improve the following deficits and impairments:  Pain, Impaired UE functional use, Decreased strength, Postural dysfunction, Improper body mechanics, Decreased range of motion, Decreased endurance, Decreased activity tolerance  Visit Diagnosis: Pain in right wrist  Muscle weakness (generalized)     Problem List Patient Active Problem List   Diagnosis Date Noted  . Insomnia 01/30/2017  .  OSA (obstructive sleep apnea) 10/06/2014  . PLANTAR FASCIITIS, RIGHT 11/05/2008   Kristoffer Leamon PT, DPT, LAT, ATC  12/26/18  12:45 PM      [facility] [address] [place], [place], [address] Phone: [phone]   Fax:  [phone]  Name: Ricardo [place] MRN: [id number] Date of Birth: 05/15/1951   

## 2018-12-31 ENCOUNTER — Encounter: Payer: Self-pay | Admitting: Physical Therapy

## 2018-12-31 ENCOUNTER — Ambulatory Visit: Payer: Medicare HMO | Attending: Family Medicine | Admitting: Physical Therapy

## 2018-12-31 DIAGNOSIS — M25531 Pain in right wrist: Secondary | ICD-10-CM

## 2018-12-31 DIAGNOSIS — M6281 Muscle weakness (generalized): Secondary | ICD-10-CM | POA: Diagnosis present

## 2018-12-31 NOTE — Therapy (Signed)
[place], [place], [address] Phone: [phone]   Fax:  [phone]  Physical Therapy Treatment  Patient Details  Name: Ricardo [place] MRN: [id number] Date of Birth: Feb 19, 1951 Referring Provider (PT): Corrington, Delsa Grana, MD   Encounter Date: 12/31/2018  PT End of Session - 12/31/18 1300    Visit Number  7    Number of Visits  13    Date for PT Re-Evaluation  01/14/19    PT Start Time  0923    PT Stop Time  1231    PT Time Calculation (min)  42 min    Activity Tolerance  Patient tolerated treatment well    Behavior During Therapy  [facility] for tasks assessed/performed       Past Medical History:  Diagnosis Date  . Anal skin tag   . Anxiety   . DDD (degenerative disc disease), lumbar    L5-S1  . Depression   . Fatty liver 10/09/2006   Mild diffuse, noted on CT chest  . Headache   . Hemorrhoids   . Hyperlipidemia   . Insomnia   . Meningioma ([place])   . OA (osteoarthritis)    toe  . Sleep apnea    cpap--waiting on mouth guard, auto CPAP, 5-15 cm water  . Wears glasses     Past Surgical History:  Procedure Laterality Date  . COLONOSCOPY  2007  . CRANIOTOMY  2003  . EXCISION OF SKIN TAG N/A 06/19/2018   Procedure: EXCISION OF ANAL SKIN TAG;  Surgeon: Leighton Ruff, MD;  Location: [facility];  Service: General;  Laterality: N/A;  . HAMMER TOE SURGERY Left 09/2015  . INGUINAL HERNIA REPAIR  [id number],6226   l,r  . KNEE ARTHROSCOPY Right 2003    There were no vitals filed for this visit.  Subjective Assessment - 12/31/18 1250    Subjective  Wrist is no better or no worse    Currently in Pain?  No/denies    Pain Location  Wrist    Pain Orientation  Right    Pain Descriptors / Indicators  --   varies  " pain"   Pain Type  Chronic pain    Pain Frequency  Intermittent    Aggravating Factors   cutting,  wrist motion  end range wrist ROM    Pain Relieving Factors  stopping the  motion                       [facility] Adult PT Treatment/Exercise - 12/31/18 0001      Therapeutic Activites    Therapeutic Activities  Work Simulation    Work Simulation  cutting an apple  tried to modify technique however it did not help,  and I could not demonstrate.       Hand Exercises   Thumb Opposition  gripping using finger dynamometer gradual contraction and relax working on gripping to max level 2 x 10  max increased 5 LBS if he did with wrist neutral vs going into extension with the task.     Other Hand Exercises  thumb slide along hand to base of little finger then ulnar deviation ( stretch ) 5 X  pain decreased with reps.        Wrist Exercises   Wrist Flexion  Strengthening    Bar Weights/Barbell (Wrist Flexion)  4 lbs    Wrist Flexion Limitations  focused eccentric    Other wrist exercises  wrist flexor stretching      Manual Therapy   Manual therapy comments  MTPR along the common wrist flexors and pronator quadratus,musculature    Joint Mobilization  wrist mobs distraction with medial glides with movement into extension,  Extension less painful post.             PT Education - 12/31/18 1259    Education Details  exercise form    Person(s) Educated  Patient    Methods  Explanation;Demonstration;Tactile cues;Verbal cues    Comprehension  Returned demonstration;Verbalized understanding       PT Short Term Goals - 12/31/18 1302      PT SHORT TERM GOAL #1   Title  pt to be I with inital HEP    Baseline  able to d without cues    Time  3    Period  Weeks    Status  Achieved        PT Long Term Goals - 12/31/18 1306      PT LONG TERM GOAL #1   Title  pt to increase R wrist ROM to [facility] compared bil with </= 1/10 pain during assessment    Baseline  met for extension after manual    Time  6    Period  Weeks    Status  Partially Met      PT LONG TERM GOAL #2   Title  increase strength to >/= 4+/5 in all R wrist/ forearm movements with  </=1/10 for functional gripping, lifting and carrying activities     Time  6    Period  Weeks    Status  Unable to assess      PT LONG TERM GOAL #3   Title  pt to increase R grip strength by >/= 10# to demo improvement     Time  6    Period  Weeks    Status  Unable to assess      PT LONG TERM GOAL #4   Title  improve FOTO score to >/= 28% limited to demo improvement in function     Time  6    Period  Weeks    Status  Unable to assess            Plan - 12/31/18 1300    Clinical Impression Statement  Pain continues randomly.  Mobs with movenent into extension helpful with wrist ROM (  Full)   and less pain.  STG#1 met,  LTG#1 partially met.     PT Next Visit Plan  patient is thinking  he will keep the next visit then return to MD..  continue exercise.,  manual.    PT Home Exercise Plan  wrist flexor stretch, wrist flexion/ extension, weight radial deviation, towel grip, supination prontaiton   hand exercises  fingers O, lifting,  thumb abduction    Lift and move toward thumb.     Consulted and Agree with Plan of Care  Patient       Patient will benefit from skilled therapeutic intervention in order to improve the following deficits and impairments:     Visit Diagnosis: Pain in right wrist  Muscle weakness (generalized)     Problem List Patient Active Problem List   Diagnosis Date Noted  . Insomnia 01/30/2017  . OSA (obstructive sleep apnea) 10/06/2014  . PLANTAR FASCIITIS, RIGHT 11/05/2008    David Towson  PTA 12/31/2018, 1:09 PM  [place], [place],  [address] Phone: [phone]   Fax:  [phone]  Name: Ricardo [place] MRN: [id number] Date of Birth: October 07, 1951

## 2019-01-03 ENCOUNTER — Ambulatory Visit: Payer: Medicare HMO | Admitting: Physical Therapy

## 2019-01-03 DIAGNOSIS — M25531 Pain in right wrist: Secondary | ICD-10-CM

## 2019-01-03 DIAGNOSIS — M6281 Muscle weakness (generalized): Secondary | ICD-10-CM

## 2019-01-03 NOTE — Therapy (Signed)
[place], [address] Phone: [phone]   Fax:  [phone]  Physical Therapy Treatment / Discharge summary  Patient Details  Name: Ricardo [place] MRN: [id number] Date of Birth: 06/08/1951 Referring Provider (PT): Corrington, Delsa Grana, MD   Encounter Date: 01/03/2019  PT End of Session - 01/03/19 7672    Visit Number  8    Number of Visits  13    Date for PT Re-Evaluation  01/14/19    PT Start Time  0937    PT Stop Time  1005   shortened session due to discharge   PT Time Calculation (min)  28 min    Activity Tolerance  Patient tolerated treatment well    Behavior During Therapy  [facility] for tasks assessed/performed       Past Medical History:  Diagnosis Date  . Anal skin tag   . Anxiety   . DDD (degenerative disc disease), lumbar    L5-S1  . Depression   . Fatty liver 10/09/2006   Mild diffuse, noted on CT chest  . Headache   . Hemorrhoids   . Hyperlipidemia   . Insomnia   . Meningioma ([place])   . OA (osteoarthritis)    toe  . Sleep apnea    cpap--waiting on mouth guard, auto CPAP, 5-15 cm water  . Wears glasses     Past Surgical History:  Procedure Laterality Date  . COLONOSCOPY  2007  . CRANIOTOMY  2003  . EXCISION OF SKIN TAG N/Walker 06/19/2018   Procedure: EXCISION OF ANAL SKIN TAG;  Surgeon: Ricardo Ruff, MD;  Location: [facility];  Service: General;  Laterality: N/[place];  . HAMMER TOE SURGERY Left 09/2015  . INGUINAL HERNIA REPAIR  [id number]   l,r  . KNEE ARTHROSCOPY Right 2003    There were no vitals filed for this visit.  Subjective Assessment - 01/03/19 0942    Subjective  "no big changes in the wrist still some soreness with gripping and various activities"     Currently in Pain?  No/denies    Pain Score  --   continues to get up to Walker 4-5/10   Pain Orientation  Right         [facility] PT Assessment - 01/03/19 0001      Assessment   Medical Diagnosis   R wrist pain    Referring Provider (PT)  Corrington, Kip A, MD      Observation/Other Assessments   Focus on Therapeutic Outcomes (FOTO)   40% limited      AROM   Right Forearm Pronation  102 Degrees    Right Forearm Supination  90 Degrees    Left Forearm Pronation  112 Degrees    Left Forearm Supination  88 Degrees    Right Wrist Extension  62 Degrees    Right Wrist Flexion  58 Degrees    Right Wrist Radial Deviation  28 Degrees    Right Wrist Ulnar Deviation  28 Degrees    Left Wrist Extension  72 Degrees    Left Wrist Flexion  60 Degrees    Left Wrist Radial Deviation  10 Degrees    Left Wrist Ulnar Deviation  30 Degrees      Strength   Right Forearm Pronation  4/5    Right Forearm Supination  4/5    Left Forearm Pronation  4+/5    Left Forearm Supination  4+/5    Right Wrist Flexion  4/5    Right Wrist Extension  4/5    Right Hand Grip (lbs)  55.66                           PT Education - 01/03/19 1003    Education Details  muscle anatomy and benefits of continued strengthening. Discussed lack of progress and benefits of getting back in with the MD for further reassessment. how to progress strengthening by increasing reps/ sets and then gradually working on progressing resistance.     Person(s) Educated  Patient    Methods  Explanation;Verbal cues    Comprehension  Verbalized understanding;Verbal cues required       PT Short Term Goals - 12/31/18 1302      PT SHORT TERM GOAL #1   Title  pt to be I with inital HEP    Baseline  able to d without cues    Time  3    Period  Weeks    Status  Achieved        PT Long Term Goals - 01/03/19 1010      PT LONG TERM GOAL #1   Title  pt to increase R wrist ROM to [facility] compared bil with </= 1/10 pain during assessment    Time  6    Period  Weeks    Status  Partially Met      PT LONG TERM GOAL #2   Title  increase strength to >/= 4+/5 in all R wrist/ forearm movements with </=1/10 for functional  gripping, lifting and carrying activities     Time  6    Period  Weeks    Status  Not Met      PT LONG TERM GOAL #3   Title  pt to increase R grip strength by >/= 10# to demo improvement     Time  6    Period  Weeks    Status  Partially Met      PT LONG TERM GOAL #4   Title  improve FOTO score to >/= 28% limited to demo improvement in function     Time  6    Period  Weeks    Status  Not Met      PT LONG TERM GOAL #5   Title  pt to return to gym exericse with </= 1/10 wrist pain for patient's personal goals    Time  6    Period  Weeks    Status  Not Met            Plan - 01/03/19 1005    Clinical Impression Statement  [name] continues to report pain intermittently with certain activities but is has difficulty describing what causes symtpoms. He demonstrated fucntional mobility with only report of pain and demosntrates minimal functional progress, and FOTO score has dropped by 1 point. based on lack of functional progress and continued symptomology after 8 visits plan to refer back to MD for further assessment which pt agreed with.     PT Treatment/Interventions  ADLs/Self Care Home Management;Cryotherapy;Electrical Stimulation;Iontophoresis 29m/ml Dexamethasone;Moist Heat;Ultrasound;Therapeutic activities;Dry needling;Taping;Manual techniques;Passive range of motion;Patient/family education;Therapeutic exercise;Neuromuscular re-education    PT Next Visit Plan  D/C    PT Home Exercise Plan  wrist flexor stretch, wrist flexion/ extension, weight radial deviation, towel grip, supination prontaiton   hand exercises  fingers O, lifting,  thumb abduction    Lift and move toward thumb.  Consulted and Agree with Plan of Care  Patient       Patient will benefit from skilled therapeutic intervention in order to improve the following deficits and impairments:  Pain, Impaired UE functional use, Decreased strength, Postural dysfunction, Improper body mechanics, Decreased range of  motion, Decreased endurance, Decreased activity tolerance  Visit Diagnosis: Pain in right wrist  Muscle weakness (generalized)     Problem List Patient Active Problem List   Diagnosis Date Noted  . Insomnia 01/30/2017  . OSA (obstructive sleep apnea) 10/06/2014  . PLANTAR FASCIITIS, RIGHT 11/05/2008    Ricardo [place] 01/03/2019, 10:10 AM  [facility] [address] [place], [place], [address] Phone: [phone]   Fax:  [phone]  Name: Ricardo [place] MRN: [id number] Date of Birth: Nov 19, 1950       PHYSICAL THERAPY DISCHARGE SUMMARY  Visits from Start of Care: 8  Current functional level related to goals / functional outcomes: See goals   Remaining deficits: Continued pain with intermittent activities, see assessment.    Education / Equipment: HEP, posture, anatomy  Plan: Patient agrees to discharge.  Patient goals were not met. Patient is being discharged due to lack of progress.  ?????    Sheriff Rodenberg PT, DPT, LAT, ATC  01/03/19  10:10 AM

## 2019-11-09 ENCOUNTER — Ambulatory Visit: Payer: Medicare Other | Attending: Internal Medicine

## 2019-11-09 DIAGNOSIS — Z23 Encounter for immunization: Secondary | ICD-10-CM | POA: Insufficient documentation

## 2019-11-09 NOTE — Progress Notes (Signed)
   Covid-19 Vaccination Clinic  Name:  Ricardo [place]    MRN: [id number] DOB: January 20, 1951  11/09/2019  [name] was observed post Covid-19 immunization for 15 minutes without incidence. He was provided with Vaccine Information Sheet and instruction to access the V-Safe system.   [name] was instructed to call 911 with any severe reactions post vaccine: Marland Kitchen Difficulty breathing  . Swelling of your face and throat  . A fast heartbeat  . A bad rash all over your body  . Dizziness and weakness    Immunizations Administered    Name Date Dose VIS Date Route   Pfizer COVID-19 Vaccine 11/09/2019 12:29 PM 0.3 mL 10/10/2019 Intramuscular   Manufacturer: [employer]   Lot: [id number]   [name]: [id number]

## 2019-11-28 ENCOUNTER — Ambulatory Visit: Payer: Medicare HMO

## 2019-11-29 ENCOUNTER — Ambulatory Visit: Payer: Medicare HMO | Attending: Internal Medicine

## 2019-11-29 DIAGNOSIS — Z23 Encounter for immunization: Secondary | ICD-10-CM

## 2019-11-29 NOTE — Progress Notes (Signed)
   Covid-19 Vaccination Clinic  Name:  Ricardo [place]    MRN: [id number] DOB: 14-Feb-1951  11/29/2019  Mr. Ricardo Walker was observed post Covid-19 immunization for 15 minutes without incidence. He was provided with Vaccine Information Sheet and instruction to access the V-Safe system.   Mr. Ricardo [place] was instructed to call 911 with any severe reactions post vaccine: Marland Kitchen Difficulty breathing  . Swelling of your face and throat  . A fast heartbeat  . A bad rash all over your body  . Dizziness and weakness    Immunizations Administered    Name Date Dose VIS Date Route   Pfizer COVID-19 Vaccine 11/29/2019  2:11 PM 0.3 mL 10/10/2019 Intramuscular   Manufacturer: [place]   Lot: [id number]   [place]: [id number]
